# Patient Record
Sex: Male | Born: 1965 | Race: White | Hispanic: No | Marital: Married | Smoking: Former smoker
Health system: Southern US, Community
[De-identification: ages and names within clinical notes are randomized; demographics above are authoritative.]

## PROBLEM LIST (undated history)

## (undated) DIAGNOSIS — M549 Dorsalgia, unspecified: Secondary | ICD-10-CM

## (undated) DIAGNOSIS — G8929 Other chronic pain: Secondary | ICD-10-CM

## (undated) DIAGNOSIS — M519 Unspecified thoracic, thoracolumbar and lumbosacral intervertebral disc disorder: Secondary | ICD-10-CM

## (undated) DIAGNOSIS — N200 Calculus of kidney: Secondary | ICD-10-CM

## (undated) DIAGNOSIS — H5440 Blindness, one eye, unspecified eye: Secondary | ICD-10-CM

## (undated) HISTORY — DX: Blindness, one eye, unspecified eye: H54.40

## (undated) HISTORY — DX: Calculus of kidney: N20.0

## (undated) HISTORY — PX: OTHER SURGICAL HISTORY: SHX169

## (undated) HISTORY — DX: Unspecified thoracic, thoracolumbar and lumbosacral intervertebral disc disorder: M51.9

---

## 2004-07-29 ENCOUNTER — Ambulatory Visit: Payer: Self-pay | Admitting: Family Medicine

## 2004-07-29 ENCOUNTER — Observation Stay (HOSPITAL_COMMUNITY): Admission: EM | Admit: 2004-07-29 | Discharge: 2004-07-30 | Payer: Self-pay | Admitting: Emergency Medicine

## 2004-09-01 ENCOUNTER — Encounter
Admission: RE | Admit: 2004-09-01 | Discharge: 2004-09-24 | Payer: Self-pay | Admitting: Physical Medicine and Rehabilitation

## 2006-03-25 ENCOUNTER — Encounter: Admission: RE | Admit: 2006-03-25 | Discharge: 2006-03-25 | Payer: Self-pay | Admitting: Family Medicine

## 2008-08-17 HISTORY — PX: TONSILLECTOMY: SUR1361

## 2011-08-09 ENCOUNTER — Encounter: Payer: Self-pay | Admitting: Emergency Medicine

## 2011-08-09 ENCOUNTER — Emergency Department
Admit: 2011-08-09 | Discharge: 2011-08-09 | Disposition: A | Discharge status: Home / Self Care | Payer: BC Managed Care – PPO

## 2011-08-09 ENCOUNTER — Emergency Department
Admission: EM | Admit: 2011-08-09 | Discharge: 2011-08-09 | Disposition: A | Discharge status: Home / Self Care | Payer: BC Managed Care – PPO | Source: Home / Self Care | Attending: Emergency Medicine | Admitting: Emergency Medicine

## 2011-08-09 DIAGNOSIS — M545 Low back pain, unspecified: Secondary | ICD-10-CM

## 2011-08-09 HISTORY — DX: Other chronic pain: G89.29

## 2011-08-09 HISTORY — DX: Dorsalgia, unspecified: M54.9

## 2011-08-09 MED ORDER — ETODOLAC 500 MG PO TABS: 500.0000 mg | ORAL_TABLET | Freq: Two times a day (BID) | ORAL | Status: AC

## 2011-08-09 MED ORDER — CYCLOBENZAPRINE HCL 10 MG PO TABS: 10.0000 mg | ORAL_TABLET | Freq: Three times a day (TID) | ORAL | Status: AC | PRN

## 2011-08-09 MED ORDER — PREDNISONE (PAK) 10 MG PO TABS: ORAL_TABLET | ORAL | Status: AC

## 2011-08-09 NOTE — ED Notes (Signed)
Hx of chronic back pain with deteriorating L4 and L5; being followed by Dr.Ramos( with [facility]); 2 days ago braked In traffic and jostled back leading to progressively worse pain. Took Oxycodone at 11am; Ibuprofen at 1:30pm.

## 2011-08-09 NOTE — ED Provider Notes (Addendum)
History     CSN: [id number]  Arrival date & time 08/09/11  1744   First MD Initiated Contact with Patient 08/09/11 1759      Chief Complaint  Patient presents with  . Back Pain   The patient and his wife arrived in our urgent care office [facility]) within 30 minutes of our closing time. Today is Sunday, 08/09/11.   Patient is a 45 y.o. male presenting with back pain. The history is provided by the patient and the spouse.  Back Pain  This is a recurrent problem. The current episode started 2 days ago. The problem occurs constantly. The problem has been rapidly worsening. The pain is present in the lumbar spine. The quality of the pain is described as shooting and stabbing. Radiates to: Right buttock and right upper thigh, but not any lower than this. The pain is at a severity of 10/10. The symptoms are aggravated by bending, twisting and certain positions. The pain is the same all the time. Stiffness is present all day. Pertinent negatives include no chest pain, no fever, no numbness, no abdominal pain, no bladder incontinence, no paresthesias and no weakness.   he denies any focal weakness  Per patient and his wife, Hx of chronic lumbar back pain with deteriorating L4 and L5; being followed by Dr.Ramos( with [facility]); He states that 2 days ago braked while driving,  In traffic and jostled back leading to progressively worse pain. Took Oxycodone at 11am; Ibuprofen at 1:30pm. With only minimal relief. He states he has long standing chronic lumbar back pain, has never had surgery. He states he is followed ongoing by Dr. Ethelene Hal, who prescribes #90 Percocet 10/325 every 30 days, and he takes this 3 times a day. He states he is compliant and does not abuse this. He states he has a medication contract with Dr. Ethelene Hal. He states that he tried to call Dr. Ethelene Hal office, late on Friday when the pain first started, and he physician on call advised him to go to an urgent care center  or emergency room.    Past Medical History  Diagnosis Date  . Back pain, chronic     Past Surgical History  Procedure Date  . Cholecystectomy     Family History  Problem Relation Age of Onset  . Hypertension Mother   . Stroke Father     History  Substance Use Topics  . Smoking status: Current Everyday Smoker -- 0.5 packs/day  . Smokeless tobacco: Not on file  . Alcohol Use: No      Review of Systems  Constitutional: Negative for fever.  HENT: Negative for congestion, neck pain and neck stiffness.   Eyes: Negative.   Respiratory: Negative.   Cardiovascular: Negative for chest pain, palpitations and leg swelling.  Gastrointestinal: Negative for abdominal pain and abdominal distention.  Genitourinary: Negative.  Negative for bladder incontinence and difficulty urinating.  Musculoskeletal: Positive for back pain. Negative for joint swelling.  Neurological: Negative for dizziness, tremors, seizures, weakness, light-headedness, numbness and paresthesias.  Hematological: Negative.   Psychiatric/Behavioral: Negative for hallucinations, confusion and agitation.    Allergies  Review of patient's allergies indicates no known allergies.  Home Medications   Current Outpatient Rx  Name Route Sig Dispense Refill  . OXYCODONE-ACETAMINOPHEN 10-325 MG PO TABS Oral Take 1 tablet by mouth every 8 (eight) hours as needed.      . CYCLOBENZAPRINE HCL 10 MG PO TABS Oral Take 1 tablet (10 mg total) by mouth 3 (  three) times daily as needed for muscle spasms. Take one half or 1 tablet by mouth every 8 hours as needed for muscle relaxant 21 tablet 0  . ETODOLAC 500 MG PO TABS Oral Take 1 tablet (500 mg total) by mouth 2 (two) times daily with a meal. For pain 20 tablet 0  . PREDNISONE (PAK) 10 MG PO TABS  Take as directed x6 days 21 tablet 0    BP 118/86  Pulse 90  Temp(Src) 97.4 F (36.3 C) (Oral)  Resp 16  Ht 6' (1.829 m)  Wt 200 lb (90.719 kg)  BMI 27.12 kg/m2  SpO2  99%  Physical Exam  Nursing note and vitals reviewed. Constitutional: He is oriented to person, place, and time. He appears well-developed and well-nourished. He is cooperative.  Non-toxic appearance. He appears distressed (Appears very uncomfortable from low back pain, and he moves slowly, splinting himself to avoid the pain.).  HENT:  Head: Normocephalic and atraumatic.  Mouth/Throat: Oropharynx is clear and moist.  Eyes: EOM are normal. Pupils are equal, round, and reactive to light. No scleral icterus.  Neck: Normal range of motion. Neck supple.  Cardiovascular: Normal rate, regular rhythm and normal heart sounds.   Pulmonary/Chest: Effort normal and breath sounds normal. No respiratory distress. He has no wheezes. He has no rales. He exhibits no tenderness.  Abdominal: Soft. There is no tenderness.  Musculoskeletal: He exhibits no edema.       Right hip: Normal.       Left hip: Normal.       Cervical back: He exhibits no tenderness.       Thoracic back: He exhibits no tenderness.       Lumbar back: He exhibits decreased range of motion, tenderness, bony tenderness and spasm. He exhibits no swelling, no edema, no deformity, no laceration and normal pulse.       Positive at 45, Right straight leg-raise test. Positive at 45, Left straight leg-raise test.  Negative Right Luisa Hart test. Negative Left Luisa Hart test.    Neurological: He is alert and oriented to person, place, and time. He has normal strength. He displays no atrophy, no tremor and normal reflexes. No cranial nerve deficit or sensory deficit. He exhibits normal muscle tone. Gait (Antalgic) abnormal. Coordination normal.  Reflex Scores:      Patellar reflexes are 2+ on the right side and 2+ on the left side.      Achilles reflexes are 2+ on the right side and 2+ on the left side. Skin: Skin is warm, dry and intact. No lesion and no rash noted.  Psychiatric: He has a normal mood and affect.    ED Course   Procedures   Dg Lumbar Spine Complete  08/09/2011  *RADIOLOGY REPORT*  Clinical Data: Severe low back pain.  LUMBAR SPINE - COMPLETE 4+ VIEW  Comparison:  None.  Findings:  There is no evidence of lumbar spine fracture. Alignment is normal.  Intervertebral disc spaces are maintained.  IMPRESSION: Negative.  Original Report Authenticated By: Danae Orleans, M.D.     1. Low back pain       MDM  I reviewed and negative x-ray report with him and wife.  My assessment is severe lumbar pain, possibly radiculopathy. But neurologic exam is intact. And this diagnosis and management is complicated by his history of chronic pain, followed by Dr. Ethelene Hal.  I went online to the [facility] control drug registry site, and printed out patient's narcotic prescription history over the  past 6 months. Indeed, he has been getting #90 Percocet 10/325 every 30 days from 04/15/11 to the present. The last such prescription by Dr. Ethelene Hal' office was 07/13/11. However, he did receive #10 as acetaminophen codeine #30 tablets and 120 mL's of hydrocodone-chlorpheniramine suspension from [facility], [place] in [place] on 07/16/11 --- I confronted him with this printout, and patient states that those 2 prescriptions were for a bronchitis/URI severe cough at that time.-The patient answered this in a forthright manner and was not belligerent or posturing when I addressed this issue with him.  I prescribed prednisone Dosepak, Lodine, and Flexeril (see details in the AVS). I told him to continue the Percocet prescribed by Dr. Ethelene Hal 3 times a day, and that I would not prescribe any more Percocet or any controlled drugs for the pain. Patient responded to this appropriately and he stated that he was appreciative for my care.  Advised patient that I will be sending a copy of this note to Dr. Ethelene Hal. Advised him to followup with Dr. Ethelene Hal in one week, sooner if worsening or new symptoms.        Lonell Face, MD 08/09/11 2009  Lonell Face, MD 08/09/11 2011

## 2012-08-17 DIAGNOSIS — N200 Calculus of kidney: Secondary | ICD-10-CM

## 2012-08-17 HISTORY — PX: CHOLECYSTECTOMY: SHX55

## 2012-08-17 HISTORY — DX: Calculus of kidney: N20.0

## 2013-01-31 ENCOUNTER — Telehealth: Payer: Self-pay | Admitting: Family Medicine

## 2013-01-31 ENCOUNTER — Ambulatory Visit (INDEPENDENT_AMBULATORY_CARE_PROVIDER_SITE_OTHER): Payer: BC Managed Care – PPO | Admitting: Family Medicine

## 2013-01-31 ENCOUNTER — Encounter: Payer: Self-pay | Admitting: Family Medicine

## 2013-01-31 VITALS — BP 114/70 | HR 73 | Temp 97.7°F | Ht 72.0 in | Wt 206.0 lb

## 2013-01-31 DIAGNOSIS — R300 Dysuria: Secondary | ICD-10-CM | POA: Insufficient documentation

## 2013-01-31 DIAGNOSIS — N23 Unspecified renal colic: Secondary | ICD-10-CM | POA: Insufficient documentation

## 2013-01-31 LAB — POCT UA - MICROSCOPIC ONLY
Casts, Ur, LPF, POC: NEGATIVE
Crystals, Ur, HPF, POC: NEGATIVE
Yeast, UA: NEGATIVE

## 2013-01-31 LAB — POCT URINALYSIS DIPSTICK
Bilirubin, UA: NEGATIVE
Glucose, UA: NEGATIVE
Ketones, UA: NEGATIVE
Leukocytes, UA: NEGATIVE
Nitrite, UA: NEGATIVE
Spec Grav, UA: 1.01
Urobilinogen, UA: NEGATIVE
pH, UA: 8.5

## 2013-01-31 MED ORDER — SULFAMETHOXAZOLE-TRIMETHOPRIM 800-160 MG PO TABS: 1.0000 | ORAL_TABLET | Freq: Two times a day (BID) | ORAL | Status: DC

## 2013-01-31 MED ORDER — TAMSULOSIN HCL 0.4 MG PO CAPS: 0.4000 mg | ORAL_CAPSULE | Freq: Every day | ORAL | Status: DC

## 2013-01-31 NOTE — Progress Notes (Signed)
Patient ID: Levi Washington, male   DOB: [birth year], 47 y.o.   MRN: [id number] SUBJECTIVE: CC: Chief Complaint  Patient presents with  . Dysuria    Hx of kidney stones  . Back Pain  . Abdominal Pain    HPI: Patient seen in the late  Evening clinic Started earlier today. Called EMS then pain eased up. EMT commented that this most likely is a kidney stone. Had kidney stones 3 years ago. Pain initially 10/10 now it is 2/10. Came to get checked. No vomiting no nausea.  PMH/PSH: reviewed/updated in Epic  SH/FH: reviewed/updated in Epic  Allergies: reviewed/updated in Epic  Medications: reviewed/updated in Epic  Immunizations: reviewed/updated in Epic  ROS: As above in the HPI. All other systems are stable or negative.  OBJECTIVE: APPEARANCE:  Patient in no acute distress.The patient appeared well nourished and normally developed. Acyanotic. Waist: VITAL SIGNS:BP 114/70  Pulse 73  Temp(Src) 97.7 F (36.5 C) (Oral)  Ht 6' (1.829 m)  Wt 206 lb (93.441 kg)  BMI 27.93 kg/m2 WM NAD  SKIN: warm and  Dry without overt rashes, tattoos and scars  HEAD and Neck: without JVD, Head and scalp: normal Eyes:No scleral icterus. Fundi normal, eye movements normal. Ears: Auricle normal, canal normal, Tympanic membranes normal, insufflation normal. Nose: normal Throat: normal Neck & thyroid: normal  CHEST & LUNGS: Chest wall: normal Lungs: Clear  CVS: Reveals the PMI to be normally located. Regular rhythm, First and Second Heart sounds are normal,  absence of murmurs, rubs or gallops. Peripheral vasculature: Radial pulses: normal Dorsal pedis pulses: normal Posterior pulses: normal  ABDOMEN:  Appearance: normal Benign, no organomegaly, no masses, no Abdominal Aortic enlargement. No Guarding , no rebound. No Bruits. Bowel sounds: normal  RECTAL: N/A GU: N/A  EXTREMETIES: nonedematous. Both Femoral and Pedal pulses are normal.  MUSCULOSKELETAL:  Spine:  normal Joints: intact  NEUROLOGIC: oriented to time,place and person; nonfocal. Strength is normal Sensory is normal Reflexes are normal Cranial Nerves are normal.  ASSESSMENT:  Dysuria - Plan: POCT urinalysis dipstick, POCT UA - Microscopic Only, Urine culture, sulfamethoxazole-trimethoprim (BACTRIM DS,SEPTRA DS) 800-160 MG per tablet  Ureteric colic - Plan: tamsulosin (FLOMAX) 0.4 MG CAPS   PLAN: Orders Placed This Encounter  Procedures  . Urine culture  . POCT urinalysis dipstick  . POCT UA - Microscopic Only   Results for orders placed in visit on 01/31/13  POCT URINALYSIS DIPSTICK      Result Value Range   Color, UA yellow     Clarity, UA clear     Glucose, UA neg     Bilirubin, UA neg     Ketones, UA neg     Spec Grav, UA 1.010     Blood, UA mod     pH, UA 8.5     Protein, UA mod     Urobilinogen, UA negative     Nitrite, UA neg     Leukocytes, UA Negative    POCT UA - MICROSCOPIC ONLY      Result Value Range   WBC, Ur, HPF, POC 10-15     RBC, urine, microscopic 10-30     Bacteria, U Microscopic few     Mucus, UA large     Epithelial cells, urine per micros few     Crystals, Ur, HPF, POC neg     Casts, Ur, LPF, POC neg     Yeast, UA neg     Meds ordered this encounter  Medications  .  tamsulosin (FLOMAX) 0.4 MG CAPS    Sig: Take 1 capsule (0.4 mg total) by mouth daily.    Dispense:  15 capsule    Refill:  0  . sulfamethoxazole-trimethoprim (BACTRIM DS,SEPTRA DS) 800-160 MG per tablet    Sig: Take 1 tablet by mouth 2 (two) times daily.    Dispense:  14 tablet    Refill:  0   Strainer given.  Increased fluids 2L /24 hour  If stone not passed within a week RTcc for  Recheck. To ED if acute relapse and no relief. Patient has opioid analgesics.   Return if symptoms worsen or fail to improve.  Gatlyn Lipari P. Modesto Charon, M.D.

## 2013-01-31 NOTE — Telephone Encounter (Signed)
Dysuria, frequency, and scant urination since this morning.  Denies hematuria or abd/back pain.  Appt scheduled for this evening.  Pt aware.

## 2013-02-09 ENCOUNTER — Telehealth: Payer: Self-pay | Admitting: Family Medicine

## 2013-02-09 ENCOUNTER — Ambulatory Visit (INDEPENDENT_AMBULATORY_CARE_PROVIDER_SITE_OTHER): Payer: BC Managed Care – PPO | Admitting: Physician Assistant

## 2013-02-09 ENCOUNTER — Ambulatory Visit (INDEPENDENT_AMBULATORY_CARE_PROVIDER_SITE_OTHER): Payer: BC Managed Care – PPO

## 2013-02-09 ENCOUNTER — Encounter: Payer: Self-pay | Admitting: Physician Assistant

## 2013-02-09 VITALS — BP 119/74 | HR 75 | Temp 99.0°F | Wt 210.0 lb

## 2013-02-09 DIAGNOSIS — M25561 Pain in right knee: Secondary | ICD-10-CM

## 2013-02-09 DIAGNOSIS — M25569 Pain in unspecified knee: Secondary | ICD-10-CM

## 2013-02-09 NOTE — Progress Notes (Signed)
Subjective:     Patient ID: Levi Washington, male   DOB: 02-Nov-1965, 47 y.o.   MRN: [id number]  Knee Pain      Review of Systems  All other systems reviewed and are negative.  Pt twisted R knee in the mud at baseball He states no real pain at the time of the injury He then noted pain and swelling the next am Pt has tried OTC NSAIDS but sx cont Denies locking or giving way No hx of prev injury/surgery to the knee     Objective:   Physical Exam  Nursing note and vitals reviewed.  Sl effusion/no ecchy FROM knee with sl patellar click + TTP medial patella and femoral condyle Sl medial laxity no other laxity noted Good strength distal Pulses/sensory good Xray- sl medial joint narrowing bilat no acute bony abnl- will be over-read      Assessment:     R knee pain    Plan:     Rest Ice/Heat Neoprene brace for 2 weeks F/U if any locking or giving way

## 2013-02-09 NOTE — Patient Instructions (Signed)
Patellar Dislocation A dislocated kneecap (patella) happens when the patella slides to the outside of the knee. Sometimes, it slides back into place on its own. Sometimes, manipulation is needed to move the patella back into place. This injury tears the muscle and tendon connections to the patella along the inside of the knee. Other knee problems can happen with patellar dislocation. These include:  Poor healing of the tissues, with a loose joint.  Repeated dislocations.  Damage to the joint cartilage.  Broken (fractured) bone and cartilage. Patellar dislocations can be reduced by straightening the knee and pushing gently on the patella. You can move it back into normal position. A splint is normally applied after the patella is put back in place. Keep the splint on until you see your caregiver in 1 to 2 weeks. Pack your knee in ice for 20 to 30 minutes every few hours or for longer periods if you are in a cast.  If this is the first time this has happened, do not use your knee too soon. It takes 4 to 6 weeks for this injury to heal properly. Surgery to tighten up the knee may be needed if the patella keeps dislocating. See your caregiver for proper follow-up and help with rehabilitation. Document Released: 08/03/2005 Document Revised: 10/26/2011 Document Reviewed: 01/22/2007 [facility] Patient Information 2014 [place], [place].

## 2013-02-09 NOTE — Telephone Encounter (Signed)
Patient slipped and hyperextended right knee 2 days ago.    Appt scheduled for this afternoon.  Patient aware.

## 2013-10-20 ENCOUNTER — Encounter (INDEPENDENT_AMBULATORY_CARE_PROVIDER_SITE_OTHER): Payer: Self-pay

## 2013-10-20 ENCOUNTER — Ambulatory Visit (INDEPENDENT_AMBULATORY_CARE_PROVIDER_SITE_OTHER): Payer: BC Managed Care – PPO

## 2013-10-20 ENCOUNTER — Encounter: Payer: Self-pay | Admitting: Nurse Practitioner

## 2013-10-20 ENCOUNTER — Ambulatory Visit (INDEPENDENT_AMBULATORY_CARE_PROVIDER_SITE_OTHER): Payer: BC Managed Care – PPO | Admitting: Nurse Practitioner

## 2013-10-20 VITALS — BP 115/74 | HR 85 | Temp 98.8°F | Ht 72.0 in | Wt 210.0 lb

## 2013-10-20 DIAGNOSIS — R1310 Dysphagia, unspecified: Secondary | ICD-10-CM

## 2013-10-20 MED ORDER — PANTOPRAZOLE SODIUM 40 MG PO TBEC: 40.0000 mg | DELAYED_RELEASE_TABLET | Freq: Every day | ORAL | Status: DC

## 2013-10-20 NOTE — Progress Notes (Signed)
   Subjective:    Patient ID: Levi Washington, male    DOB: 03/30/1966, 48 y.o.   MRN: [id number]  HPI Patient  In c/o chest discomfort- When he swallows he gets chest pain- feels like food and liquids are getting hung in chest. Only happens when he eats- Started 2 days ago. No history of GERD.    Review of Systems  Constitutional: Negative.   HENT: Positive for trouble swallowing.   Respiratory: Negative.   Cardiovascular: Positive for chest pain (only when swallows).  Gastrointestinal: Negative for nausea, vomiting, diarrhea, blood in stool and anal bleeding.  Genitourinary: Negative.   All other systems reviewed and are negative.       Objective:   Physical Exam  Constitutional: He appears well-developed and well-nourished.  Cardiovascular: Normal rate, regular rhythm and normal heart sounds.   Pulmonary/Chest: Effort normal and breath sounds normal.  Abdominal: Soft. Bowel sounds are normal. He exhibits no distension and no mass. There is no tenderness. There is no rebound and no guarding.  BP 115/74  Pulse 85  Temp(Src) 98.8 F (37.1 C) (Oral)  Ht 6' (1.829 m)  Wt 210 lb (95.255 kg)  BMI 28.47 kg/m2   kub- possible hiatal Levi Ralphshernia-Levi Amberli Ruegg, FNP '      Assessment & Plan:   1. Dysphagia   2. Possible hiatal hernia  Meds ordered this encounter  Medications  . pantoprazole (PROTONIX) 40 MG tablet    Sig: Take 1 tablet (40 mg total) by mouth daily.    Dispense:  30 tablet    Refill:  3    Order Specific Question:  Supervising Provider    Answer:  Ernestina PennaMOORE, DONALD W [[id number]]   Avoid carbonated drinks, spicy and fatty foods Referral to GI for endoscopy  Levi Daphine DeutscherMartin, FNP

## 2013-10-20 NOTE — Patient Instructions (Signed)
Hiatal Hernia A hiatal hernia occurs when a part of the stomach slides above the diaphragm. The diaphragm is the thin muscle separating the belly (abdomen) from the chest. A hiatal hernia can be something you are born with or develop over time. Hiatal hernias may allow stomach acid to flow back into your esophagus, the tube which carries food from your mouth to your stomach. If this acid causes problems it is called GERD (gastro-esophageal reflux disease).  SYMPTOMS  Common symptoms of GERD are heartburn (burning in your chest). This is worse when lying down or bending over. It may also cause belching and indigestion. Some of the things which make GERD worse are:  Increased weight pushes on stomach making acid rise more easily.  Smoking markedly increases acid production.  Alcohol decreases lower esophageal sphincter pressure (valve between stomach and esophagus), allowing acid from stomach into esophagus.  Late evening meals and going to bed with a full stomach increases pressure.  Anything that causes an increase in acid production.  Lower esophageal sphincter incompetence. DIAGNOSIS  Hiatal hernia is often diagnosed with x-rays of your stomach and small bowel. This is called an UGI (upper gastrointestinal x-ray). Sometimes a gastroscopic procedure is done. This is a procedure where your caregiver uses a flexible instrument to look into the stomach and small bowel. HOME CARE INSTRUCTIONS   Try to achieve and maintain an ideal body weight.  Avoid drinking alcoholic beverages.  Stop smoking.  Put the head of your bed on 4 to 6 inch blocks. This will keep your head and esophagus higher than your stomach. If you cannot use blocks, sleep with several pillows under your head and shoulders.  Over-the-counter medications will decrease acid production. Your caregiver can also prescribe medications for this. Take as directed.  1/2 to 1 teaspoon of an antacid taken every hour while awake, with  meals and at bedtime, will neutralize acid.  Do not take aspirin, ibuprofen (Advil or Motrin), or other nonsteroidal anti-inflammatory drugs.  Do not wear tight clothing around your chest or stomach.  Eat smaller meals and eat more frequently. This keeps your stomach from getting too full. Eat slowly.  Do not lie down for 2 or 3 hours after eating. Do not eat or drink anything 1 to 2 hours before going to bed.  Avoid caffeine beverages (colas, coffee, cocoa, tea), fatty foods, citrus fruits and all other foods and drinks that contain acid and that seem to increase the problems.  Avoid bending over, especially after eating. Also avoid straining during bowel movements or when urinating or lifting things. Anything that increases the pressure in your belly increases the amount of acid that may be pushed up into your esophagus. SEEK IMMEDIATE MEDICAL CARE IF:  There is change in location (pain in arms, neck, jaw, teeth or back) of your pain, or the pain is getting worse.  You also experience nausea, vomiting, sweating (diaphoresis), or shortness of breath.  You develop continual vomiting, vomit blood or coffee ground material, have bright red blood in your stools, or have black tarry stools. Some of these symptoms could signal other problems such as heart disease. MAKE SURE YOU:   Understand these instructions.  Monitor your condition.  Contact your caregiver if you are not doing well or are getting worse. Document Released: 10/24/2003 Document Revised: 10/26/2011 Document Reviewed: 08/03/2005 [employer] Patient Information 2014 [employer].  

## 2013-10-30 ENCOUNTER — Encounter: Payer: Self-pay | Admitting: Internal Medicine

## 2013-12-05 ENCOUNTER — Encounter: Payer: Self-pay | Admitting: Internal Medicine

## 2013-12-05 ENCOUNTER — Ambulatory Visit (INDEPENDENT_AMBULATORY_CARE_PROVIDER_SITE_OTHER): Payer: BC Managed Care – PPO | Admitting: Internal Medicine

## 2013-12-05 VITALS — BP 90/64 | HR 72 | Ht 71.0 in | Wt 206.4 lb

## 2013-12-05 DIAGNOSIS — K2100 Gastro-esophageal reflux disease with esophagitis, without bleeding: Secondary | ICD-10-CM

## 2013-12-05 DIAGNOSIS — R1319 Other dysphagia: Secondary | ICD-10-CM

## 2013-12-05 NOTE — Patient Instructions (Addendum)
CC: Dr Leodis SiasFrancis Wong, Paulene FloorMary Martin FNP

## 2013-12-05 NOTE — Progress Notes (Signed)
Martha ClanRichard A Parsley 1966/03/07 [id number]  Note: This dictation was prepared with Dragon digital system. Any transcriptional errors that result from this procedure are unintentional.   History of Present Illness:  This is a 48 year old white male with an acute episode of odynophagia and substernal chest pain on 10/20/2013. He was seen by Paulene FloorMary Martin and was put on Protonix 40 mg daily. Within several days, the pain subsided and he has not had any recurrence of the pain. He denies dysphagia, odynophagia or heartburn. He denies history of gastroesophageal reflux. He takes occasional Advil. He takes oxycodone 2 tablets a day for back pain. His weight has been stable. He denies hoarseness, nocturnal regurgitation or nocturnal cough.    Past Medical History  Diagnosis Date  . Back pain, chronic   . Kidney stones 2014  . Blind left eye   . Lumbar disc disorder     Past Surgical History  Procedure Laterality Date  . Cholecystectomy    . Tonsillectomy    . Eye sugery Left     No Known Allergies  Family history and social history have been reviewed.  Review of Systems: Normal bowel habits.  The remainder of the 10 point ROS is negative except as outlined in the H&P  Physical Exam: General Appearance Well developed, in no distress Eyes  Non icteric  HEENT  Non traumatic, normocephalic normal voice Mouth No lesion, tongue papillated, no cheilosis Neck Supple without adenopathy, thyroid not enlarged, no carotid bruits, no JVD Lungs Clear to auscultation bilaterally COR Normal S1, normal S2, regular rhythm, no murmur, quiet precordium Abdomen soft nontender abdomen with normoactive bowel sounds Rectal not done Extremities  No pedal edema Skin No lesions Neurological Alert and oriented x 3 Psychological Normal mood and affect  Assessment and Plan:   Problem #181 48 year old white male who had an episode which was consistent with acute esophagitis. It is not clear what precipitated  but it cleared up within several days of taking Protonix. He has no pre-existing symptoms of gastroesophageal reflux and has not had any residual symptoms since he has been on Protonix. I have asked him to discontinue Protonix. He is not interested in being endoscoped at this point since he is feeling well. If the dysphagia and odynophagia returns after stopping Protonix, he understands that he should be endoscoped. I have asked him to avoid excess Advil or nonsteroidal agents and if he has to take them, he will take Protonix with them.  Problem #2 Colorectal screening. He is 48 years old. In 2 years, we will recommend a screening colonoscopy.    Hart CarwinDora M Merve Hotard 12/05/2013

## 2014-11-13 ENCOUNTER — Encounter: Payer: Self-pay | Admitting: Family Medicine

## 2014-11-13 ENCOUNTER — Ambulatory Visit (INDEPENDENT_AMBULATORY_CARE_PROVIDER_SITE_OTHER): Payer: 59 | Admitting: Family Medicine

## 2014-11-13 VITALS — BP 120/82 | HR 104 | Temp 98.2°F | Ht 71.0 in | Wt 208.0 lb

## 2014-11-13 DIAGNOSIS — B356 Tinea cruris: Secondary | ICD-10-CM | POA: Diagnosis not present

## 2014-11-13 MED ORDER — TERBINAFINE HCL 250 MG PO TABS: 250.0000 mg | ORAL_TABLET | Freq: Every day | ORAL | Status: DC

## 2014-11-13 NOTE — Addendum Note (Signed)
Addended by: Frederica KusterMILLER, STEPHEN M on: 11/13/2014 04:53 PM   Modules accepted: Orders

## 2014-11-13 NOTE — Progress Notes (Signed)
   Subjective:    Patient ID: Levi Washington, male    DOB: 1965-12-17, 49 y.o.   MRN: [id number]  HPI 49 year old gentleman with a growing rash. It's only on the left side there has been some odor associated. There is no past history of same. Patient Active Problem List   Diagnosis Date Noted  . Dysuria 01/31/2013  . Ureteric colic 01/31/2013   Outpatient Encounter Prescriptions as of 11/13/2014  Medication Sig  . oxyCODONE-acetaminophen (PERCOCET) 10-325 MG per tablet Take 1 tablet by mouth every 8 (eight) hours as needed.    . [DISCONTINUED] pantoprazole (PROTONIX) 40 MG tablet Take 1 tablet (40 mg total) by mouth daily.       Review of Systems  Constitutional: Negative.   HENT: Negative.   Eyes: Negative.   Respiratory: Negative.  Negative for shortness of breath.   Cardiovascular: Negative.  Negative for chest pain and leg swelling.  Gastrointestinal: Negative.   Genitourinary: Negative.   Musculoskeletal: Negative.   Skin: Positive for rash.  Neurological: Negative.   Psychiatric/Behavioral: Negative.   All other systems reviewed and are negative.      Objective:   Physical Exam  Skin:  Erythematous irregular border area and the left inguinal region consistent with tinea cruris.    BP 120/82 mmHg  Pulse 104  Temp(Src) 98.2 F (36.8 C) (Oral)  Ht 5\' 11"  (1.803 m)  Wt 208 lb (94.348 kg)  BMI 29.02 kg/m2       Assessment & Plan:  1 Tinea cruris Rash consistent with above fungal infection plan we will begin Lamisil 250 mg twice a day for 1 week. Also recommended Zeasorb-AF powder to be used keep the area dry after this rash clears also consider possible bacterial superinfection since it has an odor.  Frederica KusterStephen M Miller MD

## 2015-01-06 IMAGING — CR DG ABDOMEN 1V
2 series · 2 of 2 positions shown · non-contrast
Comparison: None.

CLINICAL DATA: Pain

EXAM:
ABDOMEN - 1 VIEW

[view not recorded (1 of 2)]
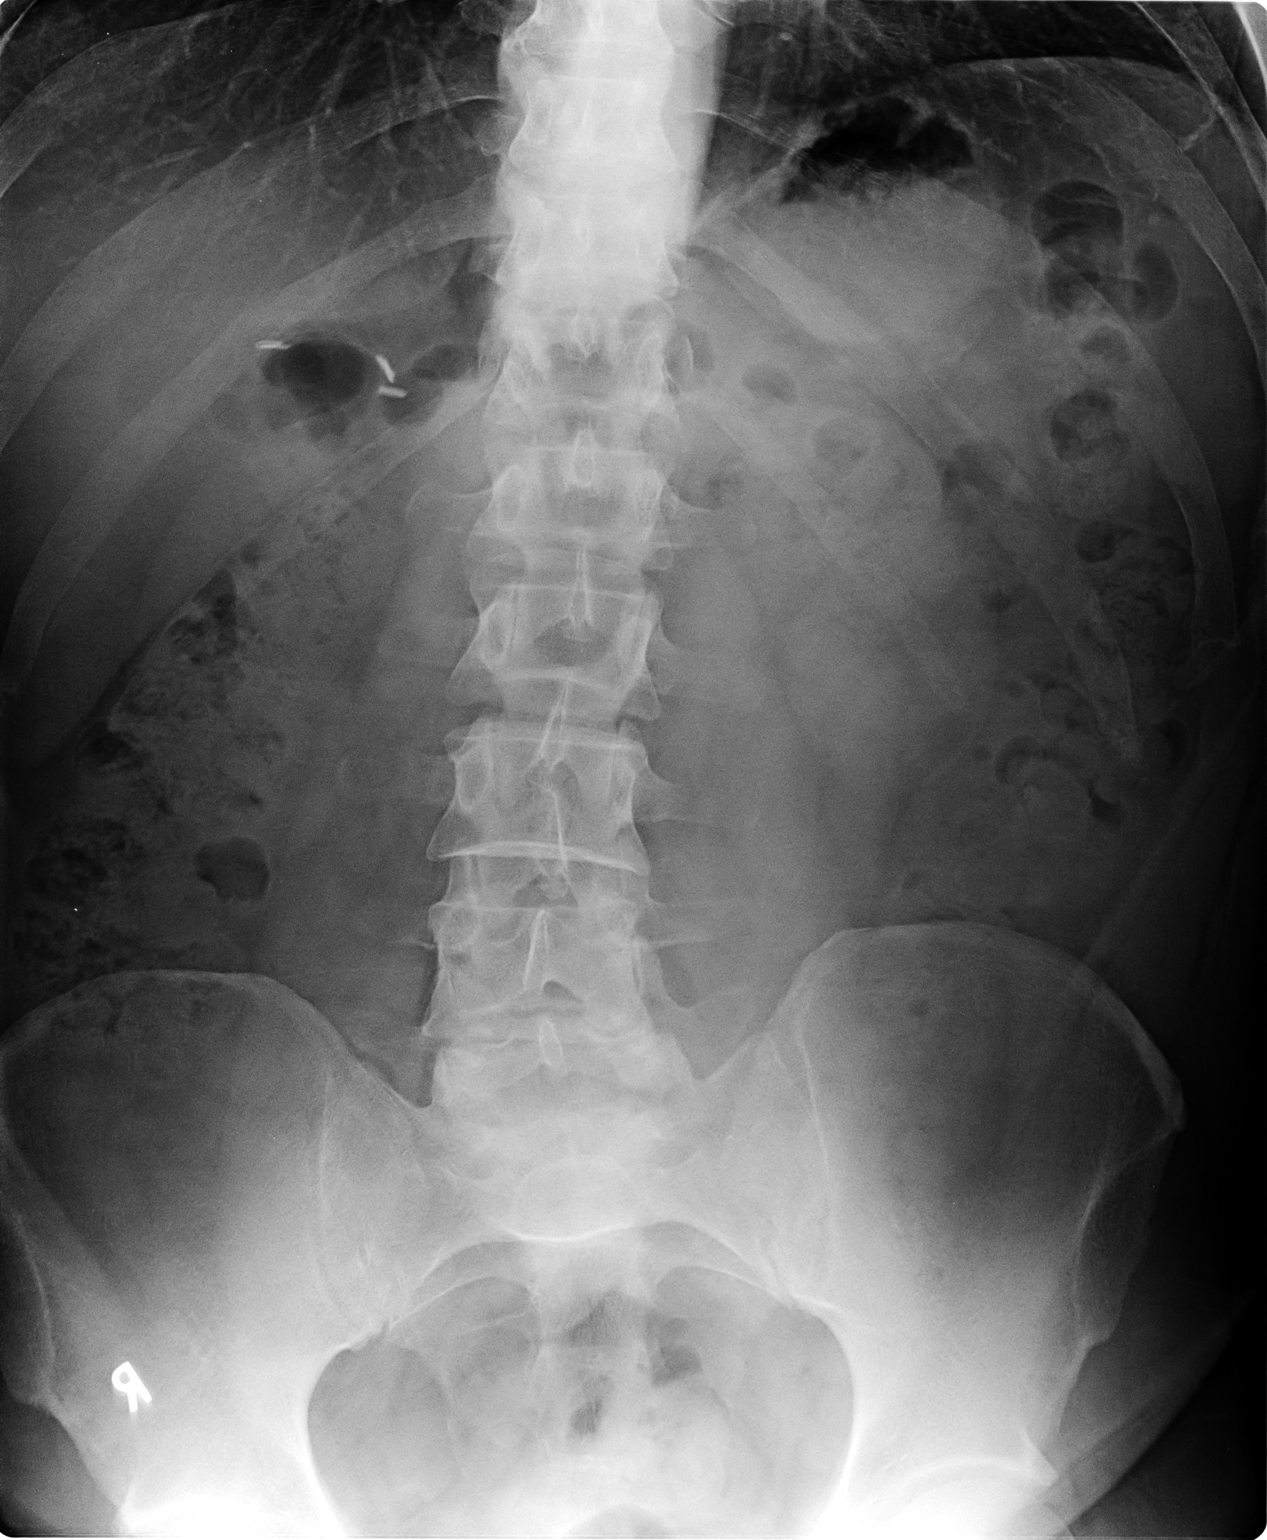

[view not recorded (2 of 2)]
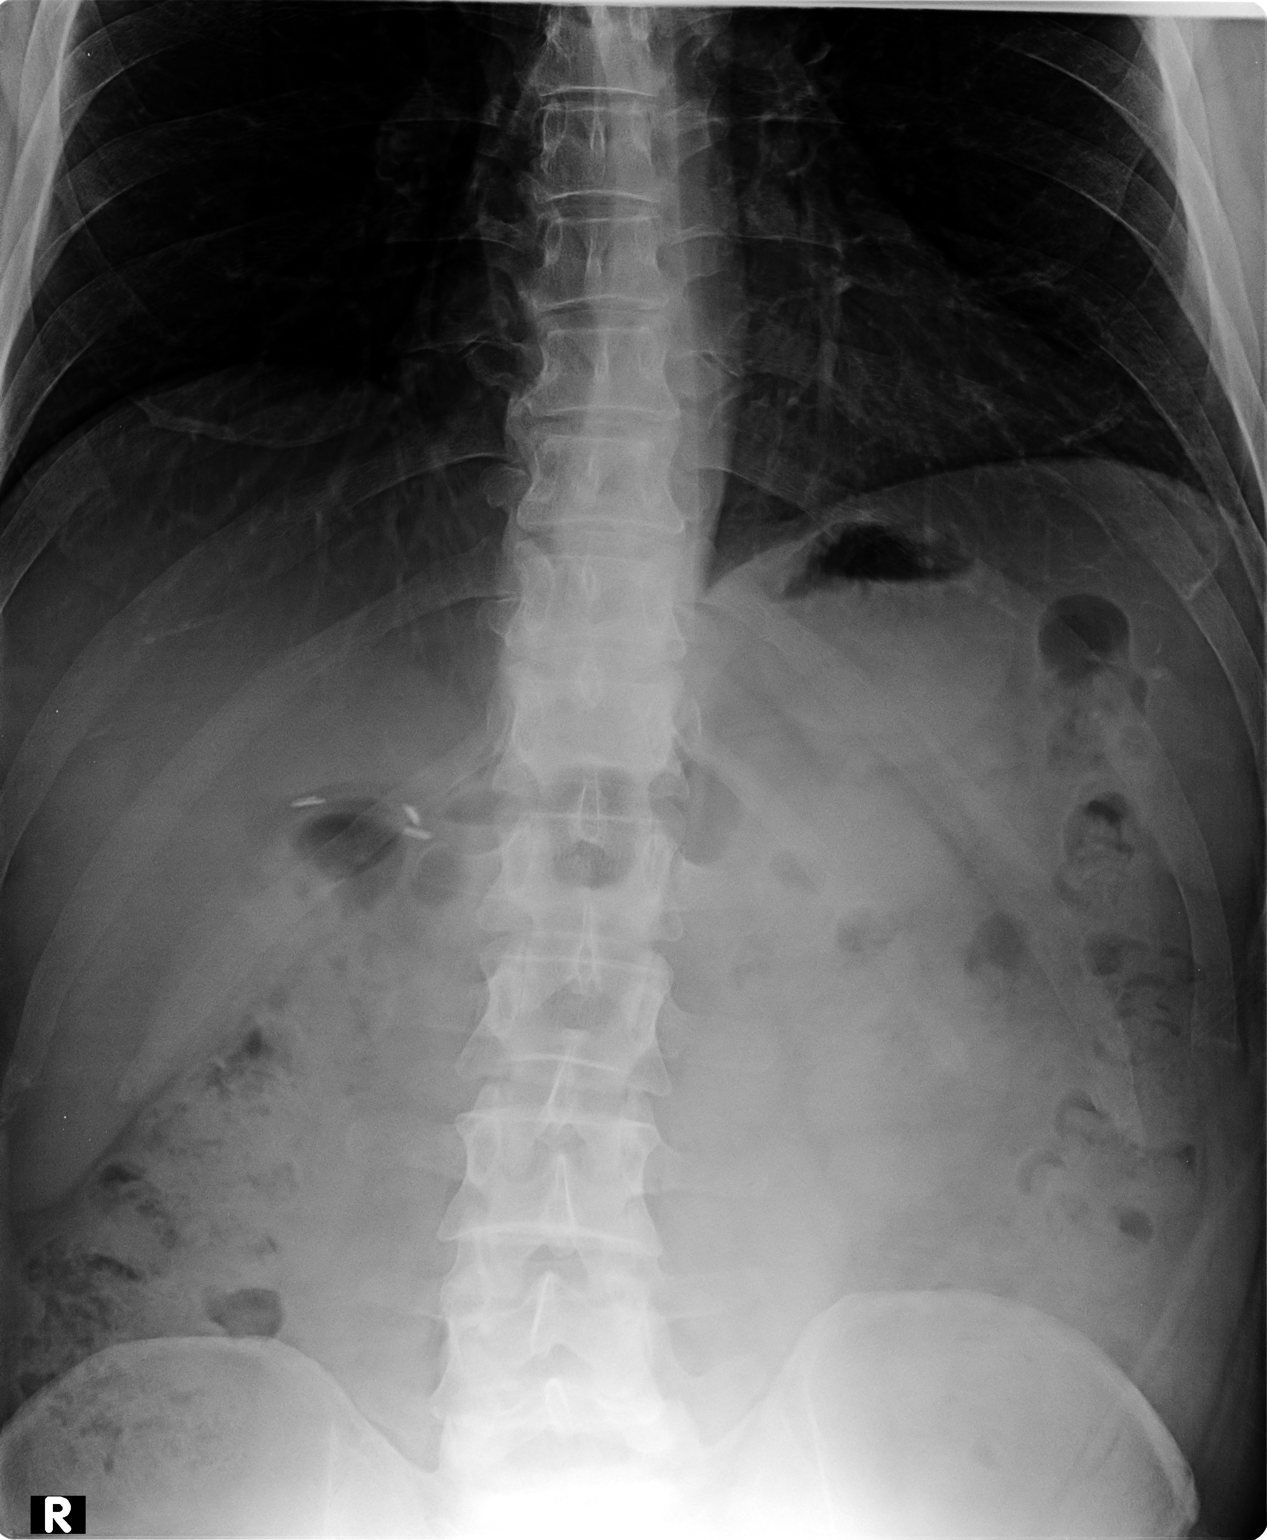

[2 of 2 positions shown; findings below may reference images not displayed]

FINDINGS: There is diffuse stool throughout the colon. The bowel gas pattern
is unremarkable. No obstruction or free air is seen on this supine
examination. There are surgical clips in the right upper quadrant
region.
IMPRESSION: Stool throughout colon.  Overall bowel gas pattern unremarkable.

## 2015-06-07 ENCOUNTER — Ambulatory Visit (INDEPENDENT_AMBULATORY_CARE_PROVIDER_SITE_OTHER): Payer: BLUE CROSS/BLUE SHIELD | Admitting: Family Medicine

## 2015-06-07 ENCOUNTER — Encounter: Payer: Self-pay | Admitting: Family Medicine

## 2015-06-07 VITALS — BP 124/81 | HR 68 | Temp 98.0°F | Ht 71.0 in | Wt 207.6 lb

## 2015-06-07 DIAGNOSIS — K439 Ventral hernia without obstruction or gangrene: Secondary | ICD-10-CM

## 2015-06-07 NOTE — Patient Instructions (Signed)
Great to meet you!  Hernia, Adult A hernia is the bulging of an organ or tissue through a weak spot in the muscles of the abdomen (abdominal wall). Hernias develop most often near the navel or groin. There are many kinds of hernias. Common kinds include:  Femoral hernia. This kind of hernia develops under the groin in the upper thigh area.  Inguinal hernia. This kind of hernia develops in the groin or scrotum.  Umbilical hernia. This kind of hernia develops near the navel.  Hiatal hernia. This kind of hernia causes part of the stomach to be pushed up into the chest.  Incisional hernia. This kind of hernia bulges through a scar from an abdominal surgery. CAUSES This condition may be caused by:  Heavy lifting.  Coughing over a long period of time.  Straining to have a bowel movement.  An incision made during an abdominal surgery.  A birth defect (congenital defect).  Excess weight or obesity.  Smoking.  Poor nutrition.  Cystic fibrosis.  Excess fluid in the abdomen.  Undescended testicles. SYMPTOMS Symptoms of a hernia include:  A lump on the abdomen. This is the first sign of a hernia. The lump may become more obvious with standing, straining, or coughing. It may get bigger over time if it is not treated or if the condition causing it is not treated.  Pain. A hernia is usually painless, but it may become painful over time if treatment is delayed. The pain is usually dull and may get worse with standing or lifting heavy objects. Sometimes a hernia gets tightly squeezed in the weak spot (strangulated) or stuck there (incarcerated) and causes additional symptoms. These symptoms may include:  Vomiting.  Nausea.  Constipation.  Irritability. DIAGNOSIS A hernia may be diagnosed with:  A physical exam. During the exam your health care provider may ask you to cough or to make a specific movement, because a hernia is usually more visible when you move.  Imaging tests.  These can include:  X-rays.  Ultrasound.  CT scan. TREATMENT A hernia that is small and painless may not need to be treated. A hernia that is large or painful may be treated with surgery. Inguinal hernias may be treated with surgery to prevent incarceration or strangulation. Strangulated hernias are always treated with surgery, because lack of blood to the trapped organ or tissue can cause it to die. Surgery to treat a hernia involves pushing the bulge back into place and repairing the weak part of the abdomen. HOME CARE INSTRUCTIONS  Avoid straining.  Do not lift anything heavier than 10 lb (4.5 kg).  Lift with your leg muscles, not your back muscles. This helps avoid strain.  When coughing, try to cough gently.  Prevent constipation. Constipation leads to straining with bowel movements, which can make a hernia worse or cause a hernia repair to break down. You can prevent constipation by:  Eating a high-fiber diet that includes plenty of fruits and vegetables.  Drinking enough fluids to keep your urine clear or pale yellow. Aim to drink 6-8 glasses of water per day.  Using a stool softener as directed by your health care provider.  Lose weight, if you are overweight.  Do not use any tobacco products, including cigarettes, chewing tobacco, or electronic cigarettes. If you need help quitting, ask your health care provider.  Keep all follow-up visits as directed by your health care provider. This is important. Your health care provider may need to monitor your condition. SEEK MEDICAL  CARE IF:  You have swelling, redness, and pain in the affected area.  Your bowel habits change. SEEK IMMEDIATE MEDICAL CARE IF:  You have a fever.  You have abdominal pain that is getting worse.  You feel nauseous or you vomit.  You cannot push the hernia back in place by gently pressing on it while you are lying down.  The hernia:  Changes in shape or size.  Is stuck outside the  abdomen.  Becomes discolored.  Feels hard or tender.   This information is not intended to replace advice given to you by your health care provider. Make sure you discuss any questions you have with your health care provider.   Document Released: 08/03/2005 Document Revised: 08/24/2014 Document Reviewed: 06/13/2014 Elsevier Interactive Patient Education [employer]2016 Elsevier Inc.

## 2015-06-07 NOTE — Progress Notes (Signed)
   HPI  Patient presents today here for evaluation of possible hernia.  Patient explains for about 4 months he's had right-sided abdominal pain around the site of his previous cholecystectomy laparoscopic incision Over the last month iit has worsened and he now has afrequent painful area around the site of his previous surgical scar He states that it's worse with lifting, sweeping, and worse since he started batting practice, he is a Secondary school teacherbaseball coach.  He describes it is painful to the touch, and has never been red, or hard feeling. He denies any fevers, chills, sweats  PMH: Smoking status noted ROS: Per HPI  Objective: BP 124/81 mmHg  Pulse 68  Temp(Src) 98 F (36.7 C)  Ht 5\' 11"  (1.803 m)  Wt 207 lb 9.6 oz (94.167 kg)  BMI 28.97 kg/m2 Gen: NAD, alert, cooperative with exam HEENT: NCAT CV: RRR, good S1/S2, no murmur Resp: CTABL, no wheezes, non-labored Abd: SNTND, BS present, no guarding or organomegaly Abdominal wall withwell-healed scar on right lower quadrant, he has a small palpable abdominal wall defect just below the scar Ext: No edema, warm Neuro: Alert and oriented, No gross deficits  Assessment and plan:  # bdominal wall hernia Secondary to previous surgery Refer to general surgery Red flags and signs of incarcerated hernia. RTC as needed and reasons to seek emergent care outlined    Orders Placed This Encounter  Procedures  . Ambulatory referral to General Surgery    Referral Priority:  Routine    Referral Type:  Surgical    Referral Reason:  Specialty Services Required    Requested Specialty:  General Surgery    Number of Visits Requested:  1     Murtis SinkSam Luretha Eberly, MD [facility] 06/07/2015, 9:15 AM

## 2015-06-14 ENCOUNTER — Encounter: Payer: Self-pay | Admitting: Nurse Practitioner

## 2015-06-14 ENCOUNTER — Ambulatory Visit (INDEPENDENT_AMBULATORY_CARE_PROVIDER_SITE_OTHER): Payer: BLUE CROSS/BLUE SHIELD | Admitting: Nurse Practitioner

## 2015-06-14 VITALS — BP 119/79 | HR 87 | Temp 98.0°F | Ht 71.0 in | Wt 205.0 lb

## 2015-06-14 DIAGNOSIS — L03811 Cellulitis of head [any part, except face]: Secondary | ICD-10-CM | POA: Diagnosis not present

## 2015-06-14 DIAGNOSIS — H61032 Chondritis of left external ear: Secondary | ICD-10-CM

## 2015-06-14 MED ORDER — CEPHALEXIN 500 MG PO CAPS: 500.0000 mg | ORAL_CAPSULE | Freq: Four times a day (QID) | ORAL | Status: DC

## 2015-06-14 MED ORDER — PREDNISONE 20 MG PO TABS: ORAL_TABLET | ORAL | Status: DC

## 2015-06-14 MED ORDER — METHYLPREDNISOLONE ACETATE 80 MG/ML IJ SUSP
80.0000 mg | Freq: Once | INTRAMUSCULAR | Status: AC
  Administered 2015-06-14: 80 mg via INTRAMUSCULAR

## 2015-06-14 NOTE — Progress Notes (Signed)
   Subjective:    Patient ID: Levi Washington, male    DOB: 12-12-1965, [age] y.o.   MRN: [id number]  HPI Patient comes in c/o left ear red and swollen- noticed it Tuesday morning and has gradually gotten worse- denies injury or bug bite. Says it feels hot and itches.    Review of Systems  Constitutional: Negative.   HENT: Negative.   Respiratory: Negative.   Cardiovascular: Negative.   Genitourinary: Negative.   Neurological: Negative.   Psychiatric/Behavioral: Negative.   All other systems reviewed and are negative.      Objective:   Physical Exam  Constitutional: He appears well-developed and well-nourished.  Cardiovascular: Normal rate, regular rhythm and normal heart sounds.   Pulmonary/Chest: Effort normal and breath sounds normal.  Skin: Skin is warm.  Left auricle hot to touch , edematous with posterior lymohadenopathy.  Psychiatric: He has a normal mood and affect. His behavior is normal. Judgment and thought content normal.    BP 119/79 mmHg  Pulse 87  Temp(Src) 98 F (36.7 C) (Oral)  Ht 5\' 11"  (1.803 m)  Wt 205 lb (92.987 kg)  BMI 28.60 kg/m2       Assessment & Plan:   1. Chondritis of auricle, left   2. Cellulitis of head except face    Meds ordered this encounter  Medications  . methylPREDNISolone acetate (DEPO-MEDROL) injection 80 mg    Sig:   . predniSONE (DELTASONE) 20 MG tablet    Sig: 2 po at sametime daily for 5 days- start tomorrow    Dispense:  10 tablet    Refill:  0    Order Specific Question:  Supervising Provider    Answer:  Ernestina PennaMOORE, DONALD W [[id number]]  . cephALEXin (KEFLEX) 500 MG capsule    Sig: Take 1 capsule (500 mg total) by mouth 4 (four) times daily.    Dispense:  30 capsule    Refill:  0    Order Specific Question:  Supervising Provider    Answer:  Ernestina PennaMOORE, DONALD W [[id number]]   Cool compresses RTO if no improvement  Mary-Margaret Daphine DeutscherMartin, [place]NP

## 2015-06-18 ENCOUNTER — Telehealth: Payer: Self-pay | Admitting: Family Medicine

## 2016-05-04 ENCOUNTER — Encounter: Payer: Self-pay | Admitting: Family Medicine

## 2016-05-04 ENCOUNTER — Ambulatory Visit (INDEPENDENT_AMBULATORY_CARE_PROVIDER_SITE_OTHER): Payer: 59 | Admitting: Family Medicine

## 2016-05-04 VITALS — BP 119/73 | HR 73 | Temp 98.0°F | Ht 71.0 in | Wt 194.8 lb

## 2016-05-04 DIAGNOSIS — Z0000 Encounter for general adult medical examination without abnormal findings: Secondary | ICD-10-CM | POA: Diagnosis not present

## 2016-05-04 DIAGNOSIS — H9313 Tinnitus, bilateral: Secondary | ICD-10-CM

## 2016-05-04 NOTE — Progress Notes (Signed)
   HPI  Patient presents today for an annual physical exam.  He has moderate to severe bilateral tenderness with some high frequency hearing loss, he states that the tenderness associated keep some awake at night He would like evaluation for this.  He is very active but has no regular exercise routine, he plays golf, volleyball, and frequently walks with his wife. He does not watch his diet very closely works as a traveling Barista.  He denies any other concerns today.  He is willing to get a colonoscopy.   PMH: Smoking status noted ROS: Per HPI  Objective: BP 119/73   Pulse 73   Temp 98 F (36.7 C) (Oral)   Ht '5\' 11"'$  (1.803 m)   Wt 194 lb 12.8 oz (88.4 kg)   BMI 27.17 kg/m  Gen: NAD, alert, cooperative with exam HEENT: NCAT, left eye with scarring at 5 and 6:00, dilated pupil compared to the right, EOMI CV: RRR, good S1/S2, no murmur Resp: CTABL, no wheezes, non-labored Abd: SNTND, BS present, no guarding or organomegaly Ext: No edema, warm Neuro: Alert and oriented, 2+ patellar tendon reflexes bilaterally  Assessment and plan:  # Annual physical exam Normal exam Labs, fasting Colonoscopy order placed  # Tinnitus Referring audiology, symmetric tinnitus with high-frequency hearing loss Good relief from hearing aids   Orders Placed This Encounter  Procedures  . CMP14+EGFR    Standing Status:   Future    Standing Expiration Date:   05/04/2017  . CBC with Differential    Standing Status:   Future    Standing Expiration Date:   05/04/2017  . PSA    Standing Status:   Future    Standing Expiration Date:   05/04/2017  . Lipid panel    Standing Status:   Future    Standing Expiration Date:   05/04/2017  . Ambulatory referral to Gastroenterology    Referral Priority:   Routine    Referral Type:   Consultation    Referral Reason:   Specialty Services Required    Number of Visits Requested:   [place], MD [facility] 05/04/2016, 4:44 PM

## 2016-05-04 NOTE — Patient Instructions (Addendum)
Great to see you!  Come back to get fasting labs within 1-2 weeks at your convenience

## 2016-05-05 ENCOUNTER — Other Ambulatory Visit: Payer: 59

## 2016-05-05 DIAGNOSIS — Z0000 Encounter for general adult medical examination without abnormal findings: Secondary | ICD-10-CM

## 2016-05-06 LAB — LIPID PANEL
Chol/HDL Ratio: 6.4 ratio units — ABNORMAL HIGH (ref 0.0–5.0)
Cholesterol, Total: 159 mg/dL (ref 100–199)
HDL: 25 mg/dL — ABNORMAL LOW (ref >39)
LDL Calculated: 91 mg/dL (ref 0–99)
Triglycerides: 215 mg/dL — ABNORMAL HIGH (ref 0–149)
VLDL Cholesterol Cal: 43 mg/dL — ABNORMAL HIGH (ref 5–40)

## 2016-05-06 LAB — CMP14+EGFR
ALT: 16 IU/L (ref 0–44)
AST: 13 IU/L (ref 0–40)
Albumin/Globulin Ratio: 1.5 (ref 1.2–2.2)
Albumin: 4.3 g/dL (ref 3.5–5.5)
Alkaline Phosphatase: 76 IU/L (ref 39–117)
BUN/Creatinine Ratio: 13 (ref 9–20)
BUN: 14 mg/dL (ref 6–24)
Bilirubin Total: 0.4 mg/dL (ref 0.0–1.2)
CO2: 26 mmol/L (ref 18–29)
CREATININE: 1.04 mg/dL (ref 0.76–1.27)
Calcium: 9.3 mg/dL (ref 8.7–10.2)
Chloride: 102 mmol/L (ref 96–106)
GFR calc Af Amer: 96 mL/min/{1.73_m2} (ref >59)
GFR calc non Af Amer: 83 mL/min/{1.73_m2} (ref >59)
Globulin, Total: 2.8 g/dL (ref 1.5–4.5)
Glucose: 100 mg/dL — ABNORMAL HIGH (ref 65–99)
Potassium: 4.9 mmol/L (ref 3.5–5.2)
Sodium: 143 mmol/L (ref 134–144)
Total Protein: 7.1 g/dL (ref 6.0–8.5)

## 2016-05-06 LAB — CBC WITH DIFFERENTIAL/PLATELET
Basophils Absolute: 0.1 10*3/uL (ref 0.0–0.2)
Basos: 1 %
EOS (ABSOLUTE): 0.1 10*3/uL (ref 0.0–0.4)
Eos: 2 %
HEMATOCRIT: 44.6 % (ref 37.5–51.0)
Hemoglobin: 14.9 g/dL (ref 12.6–17.7)
Immature Grans (Abs): 0 10*3/uL (ref 0.0–0.1)
Immature Granulocytes: 0 %
LYMPHS ABS: 3.1 10*3/uL (ref 0.7–3.1)
Lymphs: 44 %
MCH: 30.3 pg (ref 26.6–33.0)
MCHC: 33.4 g/dL (ref 31.5–35.7)
MCV: 91 fL (ref 79–97)
Monocytes Absolute: 0.5 10*3/uL (ref 0.1–0.9)
Monocytes: 8 %
Neutrophils Absolute: 3.1 10*3/uL (ref 1.4–7.0)
Neutrophils: 45 %
Platelets: 331 10*3/uL (ref 150–379)
RBC: 4.91 x10E6/uL (ref 4.14–5.80)
RDW: 13.4 % (ref 12.3–15.4)
WBC: 6.9 10*3/uL (ref 3.4–10.8)

## 2016-05-06 LAB — PSA: Prostate Specific Ag, Serum: 1 ng/mL (ref 0.0–4.0)

## 2016-07-27 ENCOUNTER — Ambulatory Visit: Payer: 59 | Attending: Family Medicine | Admitting: Audiology

## 2016-12-29 ENCOUNTER — Ambulatory Visit (INDEPENDENT_AMBULATORY_CARE_PROVIDER_SITE_OTHER): Payer: 59 | Admitting: Nurse Practitioner

## 2016-12-29 ENCOUNTER — Encounter: Payer: Self-pay | Admitting: Nurse Practitioner

## 2016-12-29 VITALS — BP 125/79 | HR 73 | Temp 97.2°F | Ht 71.0 in | Wt 199.0 lb

## 2016-12-29 DIAGNOSIS — J069 Acute upper respiratory infection, unspecified: Secondary | ICD-10-CM

## 2016-12-29 MED ORDER — AZITHROMYCIN 250 MG PO TABS: ORAL_TABLET | ORAL | 0 refills | Status: DC

## 2016-12-29 MED ORDER — METHYLPREDNISOLONE ACETATE 80 MG/ML IJ SUSP
80.0000 mg | Freq: Once | INTRAMUSCULAR | Status: AC
  Administered 2016-12-29: 80 mg via INTRAMUSCULAR

## 2016-12-29 MED ORDER — HYDROCODONE-HOMATROPINE 5-1.5 MG/5ML PO SYRP: 5.0000 mL | ORAL_SOLUTION | Freq: Four times a day (QID) | ORAL | 0 refills | Status: DC | PRN

## 2016-12-29 NOTE — Progress Notes (Signed)
   Subjective:    Patient ID: Levi Washington, male    DOB: 1966-06-22, 51 y.o.   MRN: [id number]  HPI Patient comes in today c/o cough and congestion that has been going on for greater then a week. His chest muscles hurt from coughing so much. He is leaving for a cruise on Saturday and does not want o be sick.    Review of Systems  Constitutional: Negative for appetite change, chills and fever.  HENT: Positive for congestion and rhinorrhea. Negative for sore throat and trouble swallowing.   Respiratory: Positive for cough (productive- greenish).   Cardiovascular: Negative.   Genitourinary: Negative.   Neurological: Negative.   Psychiatric/Behavioral: Negative.   All other systems reviewed and are negative.      Objective:   Physical Exam  Constitutional: He appears well-developed and well-nourished. No distress.  HENT:  Right Ear: Hearing, tympanic membrane, external ear and ear canal normal.  Left Ear: Hearing, tympanic membrane, external ear and ear canal normal.  Nose: Mucosal edema and rhinorrhea present. Right sinus exhibits no maxillary sinus tenderness and no frontal sinus tenderness. Left sinus exhibits no maxillary sinus tenderness and no frontal sinus tenderness.  Mouth/Throat: Uvula is midline, oropharynx is clear and moist and mucous membranes are normal.   BP 125/79   Pulse 73   Temp 97.2 F (36.2 C) (Oral)   Ht 5\' 11"  (1.803 m)   Wt 199 lb (90.3 kg)   BMI 27.75 kg/m       Assessment & Plan:   1. Upper respiratory infection with cough and congestion    1. Take meds as prescribed 2. Use a cool mist humidifier especially during the winter months and when heat has been humid. 3. Use saline nose sprays frequently 4. Saline irrigations of the nose can be very helpful if done frequently.  * 4X daily for 1 week*  * Use of a nettie pot can be helpful with this. Follow directions with this* 5. Drink plenty of fluids 6. Keep thermostat turn down low 7.For any  cough or congestion  Use plain Mucinex- regular strength or max strength is fine   * Children- consult with Pharmacist for dosing 8. For fever or aces or pains- take tylenol or ibuprofen appropriate for age and weight.  * for fevers greater than 101 orally you may alternate ibuprofen and tylenol every  3 hours.   Meds ordered this encounter  Medications  . methylPREDNISolone acetate (DEPO-MEDROL) injection 80 mg  . azithromycin (ZITHROMAX Z-PAK) 250 MG tablet    Sig: As directed    Dispense:  6 tablet    Refill:  0    Order Specific Question:   Supervising Provider    Answer:   VINCENT, CAROL L [[id number]]  . HYDROcodone-homatropine (HYCODAN) 5-1.5 MG/5ML syrup    Sig: Take 5 mLs by mouth every 6 (six) hours as needed for cough.    Dispense:  120 mL    Refill:  0    Order Specific Question:   Supervising Provider    Answer:   Johna SheriffVINCENT, CAROL L [[id number]]   Mary-Margaret Daphine DeutscherMartin, FNP

## 2016-12-29 NOTE — Patient Instructions (Signed)

## 2017-08-13 ENCOUNTER — Encounter: Payer: 59 | Admitting: Nurse Practitioner

## 2017-08-14 ENCOUNTER — Encounter: Payer: Self-pay | Admitting: Family Medicine

## 2017-08-23 ENCOUNTER — Encounter: Payer: Self-pay | Admitting: Gastroenterology

## 2017-08-23 ENCOUNTER — Ambulatory Visit (INDEPENDENT_AMBULATORY_CARE_PROVIDER_SITE_OTHER): Payer: 59 | Admitting: Nurse Practitioner

## 2017-08-23 ENCOUNTER — Encounter: Payer: Self-pay | Admitting: Nurse Practitioner

## 2017-08-23 VITALS — BP 118/79 | HR 72 | Temp 97.3°F | Ht 71.0 in | Wt 207.0 lb

## 2017-08-23 DIAGNOSIS — Z1211 Encounter for screening for malignant neoplasm of colon: Secondary | ICD-10-CM

## 2017-08-23 DIAGNOSIS — Z0000 Encounter for general adult medical examination without abnormal findings: Secondary | ICD-10-CM | POA: Diagnosis not present

## 2017-08-23 NOTE — Patient Instructions (Signed)

## 2017-08-23 NOTE — Progress Notes (Signed)
   Subjective:    Patient ID: Levi Washington, male    DOB: 1965/10/10, 52 y.o.   MRN: [id number]  HPI Patient comes in today for annual physical. He currently has no chronic medical problems and is on no meds. He is doing well today without complaints.    Review of Systems  Constitutional: Negative for activity change and appetite change.  HENT: Negative.   Eyes: Negative for pain.  Respiratory: Negative for shortness of breath.   Cardiovascular: Negative for chest pain, palpitations and leg swelling.  Gastrointestinal: Negative for abdominal pain.  Endocrine: Negative for polydipsia.  Genitourinary: Negative.   Skin: Negative for rash.  Neurological: Negative for dizziness, weakness and headaches.  Hematological: Does not bruise/bleed easily.  Psychiatric/Behavioral: Negative.   All other systems reviewed and are negative.      Objective:   Physical Exam  Constitutional: He is oriented to person, place, and time. He appears well-developed and well-nourished.  HENT:  Head: Normocephalic.  Right Ear: External ear normal.  Left Ear: External ear normal.  Nose: Nose normal.  Mouth/Throat: Oropharynx is clear and moist.  Eyes: EOM are normal. Pupils are equal, round, and reactive to light.  Left eye deviates lateral  Neck: Normal range of motion. Neck supple. No JVD present. No thyromegaly present.  Cardiovascular: Normal rate, regular rhythm, normal heart sounds and intact distal pulses. Exam reveals no gallop and no friction rub.  No murmur heard. Pulmonary/Chest: Effort normal and breath sounds normal. No respiratory distress. He has no wheezes. He has no rales. He exhibits no tenderness.  Abdominal: Soft. Bowel sounds are normal. He exhibits no mass. There is no tenderness.  Genitourinary:  Genitourinary Comments: Patient denies any problems with private area.  Musculoskeletal: Normal range of motion. He exhibits no edema.  Lymphadenopathy:    He has no cervical  adenopathy.  Neurological: He is alert and oriented to person, place, and time. No cranial nerve deficit.  Skin: Skin is warm and dry.  Psychiatric: He has a normal mood and affect. His behavior is normal. Judgment and thought content normal.   BP 118/79   Pulse 72   Temp (!) 97.3 F (36.3 C) (Oral)   Ht '5\' 11"'$  (1.803 m)   Wt 207 lb (93.9 kg)   BMI 28.87 kg/m      Assessment & Plan:  1. Annual physical exam - CBC with Differential/Platelet - CMP14+EGFR - Lipid panel - PSA, total and free  2. Encounter for screening colonoscopy - Ambulatory referral to Gastroenterology    Labs pending Health maintenance reviewed Diet and exercise encouraged Continue all meds Follow up  In 6 months    [place], FNP

## 2017-08-24 LAB — CMP14+EGFR
A/G RATIO: 1.5 (ref 1.2–2.2)
ALBUMIN: 4.4 g/dL (ref 3.5–5.5)
ALT: 21 IU/L (ref 0–44)
AST: 15 IU/L (ref 0–40)
Alkaline Phosphatase: 74 IU/L (ref 39–117)
BUN / CREAT RATIO: 18 (ref 9–20)
BUN: 16 mg/dL (ref 6–24)
Bilirubin Total: 0.5 mg/dL (ref 0.0–1.2)
CALCIUM: 9.3 mg/dL (ref 8.7–10.2)
CO2: 20 mmol/L (ref 20–29)
Chloride: 106 mmol/L (ref 96–106)
Creatinine, Ser: 0.89 mg/dL (ref 0.76–1.27)
GFR, EST AFRICAN AMERICAN: 114 mL/min/{1.73_m2} (ref >59)
GFR, EST NON AFRICAN AMERICAN: 98 mL/min/{1.73_m2} (ref >59)
Globulin, Total: 2.9 g/dL (ref 1.5–4.5)
Glucose: 103 mg/dL — ABNORMAL HIGH (ref 65–99)
POTASSIUM: 4.5 mmol/L (ref 3.5–5.2)
Sodium: 142 mmol/L (ref 134–144)
TOTAL PROTEIN: 7.3 g/dL (ref 6.0–8.5)

## 2017-08-24 LAB — CBC WITH DIFFERENTIAL/PLATELET
BASOS: 1 %
Basophils Absolute: 0.1 10*3/uL (ref 0.0–0.2)
EOS (ABSOLUTE): 0.1 10*3/uL (ref 0.0–0.4)
EOS: 1 %
HEMATOCRIT: 42.5 % (ref 37.5–51.0)
HEMOGLOBIN: 14.9 g/dL (ref 13.0–17.7)
IMMATURE GRANS (ABS): 0 10*3/uL (ref 0.0–0.1)
IMMATURE GRANULOCYTES: 0 %
LYMPHS: 37 %
Lymphocytes Absolute: 2.6 10*3/uL (ref 0.7–3.1)
MCH: 31.1 pg (ref 26.6–33.0)
MCHC: 35.1 g/dL (ref 31.5–35.7)
MCV: 89 fL (ref 79–97)
MONOCYTES: 5 %
Monocytes Absolute: 0.4 10*3/uL (ref 0.1–0.9)
NEUTROS ABS: 3.9 10*3/uL (ref 1.4–7.0)
NEUTROS PCT: 56 %
PLATELETS: 301 10*3/uL (ref 150–379)
RBC: 4.79 x10E6/uL (ref 4.14–5.80)
RDW: 13.1 % (ref 12.3–15.4)
WBC: 6.9 10*3/uL (ref 3.4–10.8)

## 2017-08-24 LAB — LIPID PANEL
Chol/HDL Ratio: 5.4 ratio — ABNORMAL HIGH (ref 0.0–5.0)
Cholesterol, Total: 158 mg/dL (ref 100–199)
HDL: 29 mg/dL — AB (ref >39)
LDL Calculated: 90 mg/dL (ref 0–99)
Triglycerides: 193 mg/dL — ABNORMAL HIGH (ref 0–149)
VLDL Cholesterol Cal: 39 mg/dL (ref 5–40)

## 2017-08-24 LAB — PSA, TOTAL AND FREE
PSA, Free Pct: 21 %
PSA, Free: 0.21 ng/mL
Prostate Specific Ag, Serum: 1 ng/mL (ref 0.0–4.0)

## 2017-09-02 ENCOUNTER — Ambulatory Visit (AMBULATORY_SURGERY_CENTER): Payer: Self-pay

## 2017-09-02 VITALS — Ht 72.0 in | Wt 209.0 lb

## 2017-09-02 DIAGNOSIS — Z1211 Encounter for screening for malignant neoplasm of colon: Secondary | ICD-10-CM

## 2017-09-02 MED ORDER — NA SULFATE-K SULFATE-MG SULF 17.5-3.13-1.6 GM/177ML PO SOLN: 1.0000 | Freq: Once | ORAL | 0 refills | Status: AC

## 2017-09-02 NOTE — Progress Notes (Signed)
Per pt, no allergies to soy or egg products.Pt not taking any weight loss meds or using  O2 at home.  Pt refused emmi video. 

## 2017-09-09 ENCOUNTER — Encounter: Payer: Self-pay | Admitting: Gastroenterology

## 2017-09-17 ENCOUNTER — Encounter: Payer: Self-pay | Admitting: Gastroenterology

## 2017-09-17 ENCOUNTER — Other Ambulatory Visit: Payer: Self-pay

## 2017-09-17 ENCOUNTER — Ambulatory Visit (AMBULATORY_SURGERY_CENTER): Payer: Managed Care, Other (non HMO) | Admitting: Gastroenterology

## 2017-09-17 VITALS — BP 121/75 | HR 62 | Temp 98.9°F | Resp 19 | Ht 72.0 in | Wt 209.0 lb

## 2017-09-17 DIAGNOSIS — Z1211 Encounter for screening for malignant neoplasm of colon: Secondary | ICD-10-CM

## 2017-09-17 DIAGNOSIS — Z1212 Encounter for screening for malignant neoplasm of rectum: Secondary | ICD-10-CM

## 2017-09-17 MED ORDER — SODIUM CHLORIDE 0.9 % IV SOLN: 500.0000 mL | Freq: Once | INTRAVENOUS | Status: DC

## 2017-09-17 NOTE — Op Note (Signed)
[facility] Patient Name: Levi Washington Procedure Date: 09/17/2017 1:37 PM MRN: [id number] Endoscopist: Sherilyn Cooter L. Myrtie Neither , MD Age: 52 Referring MD:  Date of Birth: [birth year] Gender: Male Account #: [id number] Procedure:                Colonoscopy Indications:              Screening for colorectal malignant neoplasm, This                            is the patient's first colonoscopy Medicines:                Monitored Anesthesia Care Procedure:                Pre-Anesthesia Assessment:                           - Prior to the procedure, a History and Physical                            was performed, and patient medications and                            allergies were reviewed. The patient's tolerance of                            previous anesthesia was also reviewed. The risks                            and benefits of the procedure and the sedation                            options and risks were discussed with the patient.                            All questions were answered, and informed consent                            was obtained. Prior Anticoagulants: The patient has                            taken no previous anticoagulant or antiplatelet                            agents. ASA Grade Assessment: I - A normal, healthy                            patient. After reviewing the risks and benefits,                            the patient was deemed in satisfactory condition to                            undergo the procedure.  After obtaining informed consent, the colonoscope                            was passed under direct vision. Throughout the                            procedure, the patient's blood pressure, pulse, and                            oxygen saturations were monitored continuously. The                            Colonoscope was introduced through the anus and                            advanced to the the cecum, identified  by                            appendiceal orifice and ileocecal valve. The                            colonoscopy was performed without difficulty. The                            patient tolerated the procedure well. The quality                            of the bowel preparation was excellent. The                            ileocecal valve, appendiceal orifice, and rectum                            were photographed. The quality of the bowel                            preparation was evaluated using the BBPS [facility]                            Bowel Preparation Scale) with scores of: Right                            Colon = 3, Transverse Colon = 3 and Left Colon = 3                            (entire mucosa seen well with no residual staining,                            small fragments of stool or opaque liquid). The                            total BBPS score equals 9. The bowel preparation  used was SUPREP. Scope In: 1:46:42 PM Scope Out: 1:58:01 PM Scope Withdrawal Time: 0 hours 9 minutes 5 seconds  Total Procedure Duration: 0 hours 11 minutes 19 seconds  Findings:                 The perianal and digital rectal examinations were                            normal.                           Multiple small-mouthed diverticula were found in                            the left colon.                           The exam was otherwise without abnormality on                            direct and retroflexion views. Complications:            No immediate complications. Estimated Blood Loss:     Estimated blood loss: none. Impression:               - Diverticulosis in the left colon.                           - The examination was otherwise normal on direct                            and retroflexion views.                           - No specimens collected. Recommendation:           - Patient has a contact number available for                            emergencies.  The signs and symptoms of potential                            delayed complications were discussed with the                            patient. Return to normal activities tomorrow.                            Written discharge instructions were provided to the                            patient.                           - Resume previous diet.                           - Continue present medications.                           -  Repeat colonoscopy in 10 years for screening                            purposes. Henry L. Myrtie Neither, MD 09/17/2017 2:00:23 PM This report has been signed electronically.

## 2017-09-17 NOTE — Patient Instructions (Signed)
YOU HAD AN ENDOSCOPIC PROCEDURE TODAY AT THE [facility]:   Refer to the procedure report that was given to you for any specific questions about what was found during the examination.  If the procedure report does not answer your questions, please call your gastroenterologist to clarify.  If you requested that your care partner not be given the details of your procedure findings, then the procedure report has been included in a sealed envelope for you to review at your convenience later.  YOU SHOULD EXPECT: Some feelings of bloating in the abdomen. Passage of more gas than usual.  Walking can help get rid of the air that was put into your GI tract during the procedure and reduce the bloating. If you had a lower endoscopy (such as a colonoscopy or flexible sigmoidoscopy) you may notice spotting of blood in your stool or on the toilet paper. If you underwent a bowel prep for your procedure, you may not have a normal bowel movement for a few days.  Please Note:  You might notice some irritation and congestion in your nose or some drainage.  This is from the oxygen used during your procedure.  There is no need for concern and it should clear up in a day or so.  SYMPTOMS TO REPORT IMMEDIATELY:   Following lower endoscopy (colonoscopy or flexible sigmoidoscopy):  Excessive amounts of blood in the stool  Significant tenderness or worsening of abdominal pains  Swelling of the abdomen that is new, acute  Fever of 100F or higher  For urgent or emergent issues, a gastroenterologist can be reached at any hour by calling [phone].   DIET:  We do recommend a small meal at first, but then you may proceed to your regular diet.  Drink plenty of fluids but you should avoid alcoholic beverages for 24 hours.  MEDICATIONS: Continue present medications.  Please see handouts given to you by your recovery nurse.  ACTIVITY:  You should plan to take it easy for the rest of today and you should NOT  DRIVE or use heavy machinery until tomorrow (because of the sedation medicines used during the test).    FOLLOW UP: Our staff will call the number listed on your records the next business day following your procedure to check on you and address any questions or concerns that you may have regarding the information given to you following your procedure. If we do not reach you, we will leave a message.  However, if you are feeling well and you are not experiencing any problems, there is no need to return our call.  We will assume that you have returned to your regular daily activities without incident.  If any biopsies were taken you will be contacted by phone or by letter within the next 1-3 weeks.  Please call us at [phone] if you have not heard about the biopsies in 3 weeks.   Thank you for allowing us to provide for your healthcare needs today.   SIGNATURES/CONFIDENTIALITY: You and/or your care partner have signed paperwork which will be entered into your electronic medical record.  These signatures attest to the fact that that the information above on your After Visit Summary has been reviewed and is understood.  Full responsibility of the confidentiality of this discharge information lies with you and/or your care-partner. 

## 2017-09-20 ENCOUNTER — Telehealth: Payer: Self-pay | Admitting: *Deleted

## 2017-09-20 NOTE — Telephone Encounter (Signed)
  Follow up Call-  Call back number 09/17/2017  Post procedure Call Back phone  # [phone]  Permission to leave phone message Yes  Some recent data might be hidden     Patient questions:  Do you have a fever, pain , or abdominal swelling? No. Pain Score  0 *  Have you tolerated food without any problems? Yes.    Have you been able to return to your normal activities? Yes.    Do you have any questions about your discharge instructions: Diet   No. Medications  No. Follow up visit  No.  Do you have questions or concerns about your Care? No.  Actions: * If pain score is 4 or above: No action needed, pain <4.

## 2017-10-07 ENCOUNTER — Encounter: Payer: Self-pay | Admitting: Nurse Practitioner

## 2017-10-07 ENCOUNTER — Ambulatory Visit: Payer: Self-pay | Admitting: Nurse Practitioner

## 2017-10-07 VITALS — BP 127/78 | HR 69 | Temp 97.0°F | Ht 72.0 in | Wt 208.0 lb

## 2017-10-07 DIAGNOSIS — M5441 Lumbago with sciatica, right side: Secondary | ICD-10-CM

## 2017-10-07 MED ORDER — CYCLOBENZAPRINE HCL 10 MG PO TABS: 10.0000 mg | ORAL_TABLET | Freq: Three times a day (TID) | ORAL | 1 refills | Status: DC | PRN

## 2017-10-07 MED ORDER — NAPROXEN 500 MG PO TABS: 500.0000 mg | ORAL_TABLET | Freq: Two times a day (BID) | ORAL | 1 refills | Status: DC

## 2017-10-07 NOTE — Patient Instructions (Signed)

## 2017-10-07 NOTE — Progress Notes (Signed)
   Subjective:    Patient ID: Levi Washington, male    DOB: 1966/02/18, 52 y.o.   MRN: [id number]  HPI Levi ClanRichard A Mast comes in today for workers comp injury.  DATE OF INJURY: 10/06/17   EMPLOYER: [employer]  EXPLANATION OF INJURY:  Was lifting some batteries and when he turned he felt a pain in lower back and down leg. Was hurting more this morning. Had trouble bending over and tying his shoes. Rates pain 6-7/10 currently. sitting increases pain and standing helps pain. He used eatong pad last night which really helped     Review of Systems  Constitutional: Negative for activity change and appetite change.  HENT: Negative.   Eyes: Negative for pain.  Respiratory: Negative for shortness of breath.   Cardiovascular: Negative for chest pain, palpitations and leg swelling.  Gastrointestinal: Negative for abdominal pain.  Endocrine: Negative for polydipsia.  Genitourinary: Negative.   Skin: Negative for rash.  Neurological: Negative for dizziness, weakness and headaches.  Hematological: Does not bruise/bleed easily.  Psychiatric/Behavioral: Negative.   All other systems reviewed and are negative.      Objective:   Physical Exam  Constitutional: He is oriented to person, place, and time. He appears well-developed and well-nourished. No distress.  Cardiovascular: Normal rate and regular rhythm.  Pulmonary/Chest: Effort normal and breath sounds normal.  Musculoskeletal:  Decrease ROM of lumber spine with pain on flexion and extension. Pain radiates down back right leg No point tenderness Motor strength and sensation distally iintact (+) SLR on right  Neurological: He is alert and oriented to person, place, and time. He has normal reflexes. No cranial nerve deficit.  Psychiatric: He has a normal mood and affect. His behavior is normal. Judgment and thought content normal.    BP 127/78   Pulse 69   Temp (!) 97 F (36.1 C) (Oral)   Ht 6' (1.829 m)   Wt 208 lb (94.3 kg)    BMI 28.21 kg/m        Assessment & Plan:  1. Acute right-sided low back pain with right-sided sciatica Moist heat Rest Avoid lifting for 2-3 days  RTO prn - naproxen (NAPROSYN) 500 MG tablet; Take 1 tablet (500 mg total) by mouth 2 (two) times daily with a meal.  Dispense: 60 tablet; Refill: 1 - cyclobenzaprine (FLEXERIL) 10 MG tablet; Take 1 tablet (10 mg total) by mouth 3 (three) times daily as needed for muscle spasms.  Dispense: 30 tablet; Refill: 1   Mary-Margaret Daphine DeutscherMartin, FNP

## 2019-08-16 ENCOUNTER — Telehealth: Payer: Self-pay | Admitting: Nurse Practitioner

## 2019-08-16 NOTE — Telephone Encounter (Signed)
Apt scheduled with pcp.

## 2019-08-21 ENCOUNTER — Other Ambulatory Visit: Payer: Self-pay

## 2019-08-22 ENCOUNTER — Encounter: Payer: Self-pay | Admitting: Nurse Practitioner

## 2019-08-22 ENCOUNTER — Ambulatory Visit (INDEPENDENT_AMBULATORY_CARE_PROVIDER_SITE_OTHER): Payer: Self-pay | Admitting: Nurse Practitioner

## 2019-08-22 VITALS — BP 130/75 | HR 78 | Temp 98.4°F | Resp 20 | Ht 72.0 in | Wt 201.0 lb

## 2019-08-22 DIAGNOSIS — K4090 Unilateral inguinal hernia, without obstruction or gangrene, not specified as recurrent: Secondary | ICD-10-CM

## 2019-08-22 NOTE — Progress Notes (Signed)
   Subjective:    Patient ID: Levi Washington, male    DOB: [birth year], 54 y.o.   MRN: [id number]   Chief Complaint: right groin pain   HPI Patient comes in today c/o right groin pain. Started several months ago. Area bulges out at times and that is when it hurts the most.    Review of Systems  Constitutional: Negative for diaphoresis.  Eyes: Negative for pain.  Respiratory: Negative for shortness of breath.   Cardiovascular: Negative for chest pain, palpitations and leg swelling.  Gastrointestinal: Negative for abdominal pain.  Endocrine: Negative for polydipsia.  Genitourinary: Negative for discharge, penile pain, penile swelling, scrotal swelling and testicular pain.  Skin: Negative for rash.  Neurological: Negative for dizziness, weakness and headaches.  Hematological: Does not bruise/bleed easily.  All other systems reviewed and are negative.      Objective:   Physical Exam Vitals and nursing note reviewed.  Constitutional:      Appearance: Normal appearance.  Cardiovascular:     Rate and Rhythm: Normal rate and regular rhythm.     Pulses: Normal pulses.     Heart sounds: Normal heart sounds.  Pulmonary:     Breath sounds: Normal breath sounds.  Abdominal:     General: Abdomen is flat.     Palpations: Abdomen is soft.     Hernia: A hernia (right inguinal area with pain on palpation) is present.  Skin:    General: Skin is warm and dry.  Neurological:     General: No focal deficit present.     Mental Status: He is alert.  Psychiatric:        Mood and Affect: Mood normal.        Behavior: Behavior normal.    BP 130/75   Pulse 78   Temp 98.4 F (36.9 C)   Resp 20   Ht 6' (1.829 m)   Wt 201 lb (91.2 kg)   SpO2 98%   BMI 27.26 kg/m         Assessment & Plan:  Levi Washington in today with chief complaint of right groin pain   1. Right inguinal hernia Try to avoid heavy lifting If pain is unbearable need to go to ER - Ambulatory referral to  General Surgery  Mary-Margaret Daphine Deutscher, FNP

## 2019-08-22 NOTE — Patient Instructions (Signed)
° °Inguinal Hernia, Adult °An inguinal hernia is when fat or your intestines push through a weak spot in a muscle where your leg meets your lower belly (groin). This causes a rounded lump (bulge). This kind of hernia could also be: °· In your scrotum, if you are male. °· In folds of skin around your vagina, if you are male. °There are three types of inguinal hernias. These include: °· Hernias that can be pushed back into the belly (are reducible). This type rarely causes pain. °· Hernias that cannot be pushed back into the belly (are incarcerated). °· Hernias that cannot be pushed back into the belly and lose their blood supply (are strangulated). This type needs emergency surgery. °If you do not have symptoms, you may not need treatment. If you have symptoms or a large hernia, you may need surgery. °Follow these instructions at home: °Lifestyle °· Do these things if told by your doctor so you do not have trouble pooping (constipation): °? Drink enough fluid to keep your pee (urine) pale yellow. °? Eat foods that have a lot of fiber. These include fresh fruits and vegetables, whole grains, and beans. °? Limit foods that are high in fat and processed sugars. These include foods that are fried or sweet. °? Take medicine for trouble pooping. °· Avoid lifting heavy objects. °· Avoid standing for long amounts of time. °· Do not use any products that contain nicotine or tobacco. These include cigarettes and e-cigarettes. If you need help quitting, ask your doctor. °· Stay at a healthy weight. °General instructions °· You may try to push your hernia in by very gently pressing on it when you are lying down. Do not try to force the bulge back in if it will not push in easily. °· Watch your hernia for any changes in shape, size, or color. Tell your doctor if you see any changes. °· Take over-the-counter and prescription medicines only as told by your doctor. °· Keep all follow-up visits as told by your doctor. This is  important. °Contact a doctor if: °· You have a fever. °· You have new symptoms. °· Your symptoms get worse. °Get help right away if: °· The area where your leg meets your lower belly has: °? Pain that gets worse suddenly. °? A bulge that gets bigger suddenly, and it does not get smaller after that. °? A bulge that turns red or purple. °? A bulge that is painful when you touch it. °· You are a man, and your scrotum: °? Suddenly feels painful. °? Suddenly changes in size. °· You cannot push the hernia in by very gently pressing on it when you are lying down. Do not try to force the bulge back in if it will not push in easily. °· You feel sick to your stomach (nauseous), and that feeling does not go away. °· You throw up (vomit), and that keeps happening. °· You have a fast heartbeat. °· You cannot poop (have a bowel movement) or pass gas. °These symptoms may be an emergency. Do not wait to see if the symptoms will go away. Get medical help right away. Call your local emergency services (911 in the [place] °Summary °· An inguinal hernia is when fat or your intestines push through a weak spot in a muscle where your leg meets your lower belly (groin). This causes a rounded lump (bulge). °· If you do not have symptoms, you may not need treatment. If you have symptoms or a large hernia,   you may need surgery. °· Avoid lifting heavy objects. Also avoid standing for long amounts of time. °· Do not try to force the bulge back in if it will not push in easily. °This information is not intended to replace advice given to you by your health care provider. Make sure you discuss any questions you have with your health care provider. °Document Revised: 09/04/2017 Document Reviewed: 05/05/2017 °Elsevier Patient Education © 2020 [employer]vier Inc. ° °

## 2019-09-19 ENCOUNTER — Other Ambulatory Visit: Payer: Self-pay | Admitting: Surgery

## 2020-05-29 ENCOUNTER — Other Ambulatory Visit: Payer: Self-pay

## 2020-05-29 DIAGNOSIS — Z20822 Contact with and (suspected) exposure to covid-19: Secondary | ICD-10-CM

## 2020-05-30 LAB — SARS-COV-2, NAA 2 DAY TAT

## 2020-05-30 LAB — NOVEL CORONAVIRUS, NAA: SARS-CoV-2, NAA: NOT DETECTED

## 2020-07-26 ENCOUNTER — Encounter: Payer: Self-pay | Admitting: Nurse Practitioner

## 2020-07-26 ENCOUNTER — Ambulatory Visit (INDEPENDENT_AMBULATORY_CARE_PROVIDER_SITE_OTHER): Payer: Self-pay | Admitting: Nurse Practitioner

## 2020-07-26 DIAGNOSIS — Z5329 Procedure and treatment not carried out because of patient's decision for other reasons: Secondary | ICD-10-CM | POA: Insufficient documentation

## 2020-07-26 DIAGNOSIS — Z91199 Patient's noncompliance with other medical treatment and regimen due to unspecified reason: Secondary | ICD-10-CM

## 2021-11-13 ENCOUNTER — Ambulatory Visit (INDEPENDENT_AMBULATORY_CARE_PROVIDER_SITE_OTHER): Payer: No Typology Code available for payment source | Admitting: Nurse Practitioner

## 2021-11-13 ENCOUNTER — Ambulatory Visit (INDEPENDENT_AMBULATORY_CARE_PROVIDER_SITE_OTHER): Payer: No Typology Code available for payment source

## 2021-11-13 ENCOUNTER — Encounter: Payer: Self-pay | Admitting: Nurse Practitioner

## 2021-11-13 VITALS — BP 114/73 | HR 71 | Temp 97.7°F | Resp 20 | Ht 72.0 in | Wt 194.0 lb

## 2021-11-13 DIAGNOSIS — Z0001 Encounter for general adult medical examination with abnormal findings: Secondary | ICD-10-CM | POA: Diagnosis not present

## 2021-11-13 DIAGNOSIS — Z6826 Body mass index (BMI) 26.0-26.9, adult: Secondary | ICD-10-CM | POA: Diagnosis not present

## 2021-11-13 DIAGNOSIS — Z0000 Encounter for general adult medical examination without abnormal findings: Secondary | ICD-10-CM | POA: Diagnosis not present

## 2021-11-13 DIAGNOSIS — R0981 Nasal congestion: Secondary | ICD-10-CM

## 2021-11-13 MED ORDER — FLUTICASONE PROPIONATE 50 MCG/ACT NA SUSP: 2.0000 | Freq: Every day | NASAL | 6 refills | Status: DC

## 2021-11-13 NOTE — Patient Instructions (Signed)
Exercising to Stay Healthy °To become healthy and stay healthy, it is recommended that you do moderate-intensity and vigorous-intensity exercise. You can tell that you are exercising at a moderate intensity if your heart starts beating faster and you start breathing faster but can still hold a conversation. You can tell that you are exercising at a vigorous intensity if you are breathing much harder and faster and cannot hold a conversation while exercising. °How can exercise benefit me? °Exercising regularly is important. It has many health benefits, such as: °Improving overall fitness, flexibility, and endurance. °Increasing bone density. °Helping with weight control. °Decreasing body fat. °Increasing muscle strength and endurance. °Reducing stress and tension, anxiety, depression, or anger. °Improving overall health. °What guidelines should I follow while exercising? °Before you start a new exercise program, talk with your health care provider. °Do not exercise so much that you hurt yourself, feel dizzy, or get very short of breath. °Wear comfortable clothes and wear shoes with good support. °Drink plenty of water while you exercise to prevent dehydration or heat stroke. °Work out until your breathing and your heartbeat get faster (moderate intensity). °How often should I exercise? °Choose an activity that you enjoy, and set realistic goals. Your health care provider can help you make an activity plan that is individually designed and works best for you. °Exercise regularly as told by your health care provider. This may include: °Doing strength training two times a week, such as: °Lifting weights. °Using resistance bands. °Push-ups. °Sit-ups. °Yoga. °Doing a certain intensity of exercise for a given amount of time. Choose from these options: °A total of 150 minutes of moderate-intensity exercise every week. °A total of 75 minutes of vigorous-intensity exercise every week. °A mix of moderate-intensity and  vigorous-intensity exercise every week. °Children, pregnant women, people who have not exercised regularly, people who are overweight, and older adults may need to talk with a health care provider about what activities are safe to perform. If you have a medical condition, be sure to talk with your health care provider before you start a new exercise program. °What are some exercise ideas? °Moderate-intensity exercise ideas include: °Walking 1 mile (1.6 km) in about 15 minutes. °Biking. °Hiking. °Golfing. °Dancing. °Water aerobics. °Vigorous-intensity exercise ideas include: °Walking 4.5 miles (7.2 km) or more in about 1 hour. °Jogging or running 5 miles (8 km) in about 1 hour. °Biking 10 miles (16.1 km) or more in about 1 hour. °Lap swimming. °Roller-skating or in-line skating. °Cross-country skiing. °Vigorous competitive sports, such as football, basketball, and soccer. °Jumping rope. °Aerobic dancing. °What are some everyday activities that can help me get exercise? °Yard work, such as: °Pushing a lawn mower. °Raking and bagging leaves. °Washing your car. °Pushing a stroller. °Shoveling snow. °Gardening. °Washing windows or floors. °How can I be more active in my day-to-day activities? °Use stairs instead of an elevator. °Take a walk during your lunch break. °If you drive, park your car farther away from your work or school. °If you take public transportation, get off one stop early and walk the rest of the way. °Stand up or walk around during all of your indoor phone calls. °Get up, stretch, and walk around every 30 minutes throughout the day. °Enjoy exercise with a friend. Support to continue exercising will help you keep a regular routine of activity. °Where to find more information °You can find more information about exercising to stay healthy from: °[place] Department of Health and Human Services: www.hhs.gov °Centers for Disease Control and Prevention (  CDC): www.cdc.gov °Summary °Exercising regularly is  important. It will improve your overall fitness, flexibility, and endurance. °Regular exercise will also improve your overall health. It can help you control your weight, reduce stress, and improve your bone density. °Do not exercise so much that you hurt yourself, feel dizzy, or get very short of breath. °Before you start a new exercise program, talk with your health care provider. °This information is not intended to replace advice given to you by your health care provider. Make sure you discuss any questions you have with your health care provider. °Document Revised: 11/29/2020 Document Reviewed: 11/29/2020 °Elsevier Patient Education © 2022 Elsevier Inc. ° °

## 2021-11-13 NOTE — Progress Notes (Signed)
? ?Subjective:  ? ? Patient ID: Levi Washington, male    DOB: 1965/12/05, 56 y.o.   MRN: [id number] ? ?Chief Complaint: Annual Exam ?  ? ?HPI: ? ?Levi Washington is a 56 y.o. who identifies as a male who was assigned male at birth.  ? ?Social history: ?Lives with: wife ?Work history: Research scientist (physical sciences) ? ? ?Comes in today for follow up of the following chronic medical issues: ? ?1. Annual physical exam ?Patient in today for annual physical exam. He is on no medications and has no chronic medical problems.  ? ?2. BMI 270.-27.9 ?Weight is down 7lbs ?Wt Readings from Last 3 Encounters:  ?11/13/21 194 lb (88 kg)  ?08/22/19 201 lb (91.2 kg)  ?10/07/17 208 lb (94.3 kg)  ? ?BMI Readings from Last 3 Encounters:  ?11/13/21 26.31 kg/m?  ?08/22/19 27.26 kg/m?  ?10/07/17 28.21 kg/m?  ? ? ?New complaints: ?Sinus congestion- has used saline and afrin ? ?No Known Allergies ?Outpatient Encounter Medications as of 11/13/2021  ?Medication Sig  ? [DISCONTINUED] oxyCODONE-acetaminophen (PERCOCET) 10-325 MG tablet Take 1 tablet by mouth 2 (two) times daily.  ? ?No facility-administered encounter medications on file as of 11/13/2021.  ? ? ?Past Surgical History:  ?Procedure Laterality Date  ? CHOLECYSTECTOMY  2014  ? eye sugery Left   ? TONSILLECTOMY  2010  ? ? ?Family History  ?Problem Relation Age of Onset  ? Hypertension Mother   ? Stroke Father   ? Hiatal hernia Father   ? ? ? ? ?Controlled substance contract: n/a ? ? ? ? ?Review of Systems  ?Constitutional:  Negative for diaphoresis.  ?Eyes:  Negative for pain.  ?Respiratory:  Negative for shortness of breath.   ?Cardiovascular:  Negative for chest pain, palpitations and leg swelling.  ?Gastrointestinal:  Negative for abdominal pain.  ?Endocrine: Negative for polydipsia.  ?Skin:  Negative for rash.  ?Neurological:  Negative for dizziness, weakness and headaches.  ?Hematological:  Does not bruise/bleed easily.  ?All other systems reviewed and are negative. ? ?   ?Objective:  ?  Physical Exam ?Vitals and nursing note reviewed.  ?Constitutional:   ?   Appearance: Normal appearance. He is well-developed.  ?HENT:  ?   Head: Normocephalic.  ?   Nose: Nose normal.  ?   Mouth/Throat:  ?   Mouth: Mucous membranes are moist.  ?   Pharynx: Oropharynx is clear.  ?Eyes:  ?   Pupils: Pupils are equal, round, and reactive to light.  ?Neck:  ?   Thyroid: No thyroid mass or thyromegaly.  ?   Vascular: No carotid bruit or JVD.  ?   Trachea: Phonation normal.  ?Cardiovascular:  ?   Rate and Rhythm: Normal rate and regular rhythm.  ?Pulmonary:  ?   Effort: Pulmonary effort is normal. No respiratory distress.  ?   Breath sounds: Normal breath sounds.  ?Abdominal:  ?   General: Bowel sounds are normal.  ?   Palpations: Abdomen is soft.  ?   Tenderness: There is no abdominal tenderness.  ?Musculoskeletal:     ?   General: Normal range of motion.  ?   Cervical back: Normal range of motion and neck supple.  ?Lymphadenopathy:  ?   Cervical: No cervical adenopathy.  ?Skin: ?   General: Skin is warm and dry.  ?Neurological:  ?   Mental Status: He is alert and oriented to person, place, and time.  ?Psychiatric:     ?   Behavior: Behavior normal.     ?  Thought Content: Thought content normal.     ?   Judgment: Judgment normal.  ? ? ?BP 114/73   Pulse 71   Temp 97.7 ?F (36.5 ?C) (Temporal)   Resp 20   Ht 6' (1.829 m)   Wt 194 lb (88 kg)   SpO2 97%   BMI 26.31 kg/m?  ? ?EKG= NSR-Mary-Margaret Hassell Done, FNP ?Chest xray normal-Preliminary reading by Ronnald Collum, FNP  [facility] ? ? ?   ?Assessment & Plan:  ?Levi Washington comes in today with chief complaint of Annual Exam ? ? ?Diagnosis and orders addressed: ? ?1. Annual physical exam ?Labs pending ?- CBC with Differential/Platelet ?- CMP14+EGFR ?- Lipid panel ?- PSA, total and free ?- EKG 12-Lead ?- DG Chest 2 View ? ?2. BMI 26.0-26.9,adult ?Discussed diet and exercise for person with BMI >25 ?Will recheck weight in 3-6 months ? ? ?3. Nasal congestion ?OTC  decongestant ?- fluticasone (FLONASE) 50 MCG/ACT nasal spray; Place 2 sprays into both nostrils daily.  Dispense: 16 g; Refill: 6 ? ? ?Labs pending ?Health Maintenance reviewed ?Diet and exercise encouraged ? ?Follow up plan: ?1 year ? ? ?Mary-Margaret Hassell Done, FNP ? ? ?

## 2021-11-14 LAB — CBC WITH DIFFERENTIAL/PLATELET
Basophils Absolute: 0.1 10*3/uL (ref 0.0–0.2)
Basos: 1 %
EOS (ABSOLUTE): 0.3 10*3/uL (ref 0.0–0.4)
Eos: 4 %
Hematocrit: 43.3 % (ref 37.5–51.0)
Hemoglobin: 14.9 g/dL (ref 13.0–17.7)
Immature Grans (Abs): 0 10*3/uL (ref 0.0–0.1)
Immature Granulocytes: 0 %
Lymphocytes Absolute: 3.3 10*3/uL — ABNORMAL HIGH (ref 0.7–3.1)
Lymphs: 48 %
MCH: 30.7 pg (ref 26.6–33.0)
MCHC: 34.4 g/dL (ref 31.5–35.7)
MCV: 89 fL (ref 79–97)
Monocytes Absolute: 0.5 10*3/uL (ref 0.1–0.9)
Monocytes: 7 %
Neutrophils Absolute: 2.7 10*3/uL (ref 1.4–7.0)
Neutrophils: 40 %
Platelets: 305 10*3/uL (ref 150–450)
RBC: 4.86 x10E6/uL (ref 4.14–5.80)
RDW: 12.5 % (ref 11.6–15.4)
WBC: 6.9 10*3/uL (ref 3.4–10.8)

## 2021-11-14 LAB — CMP14+EGFR
ALT: 17 IU/L (ref 0–44)
AST: 13 IU/L (ref 0–40)
Albumin/Globulin Ratio: 1.6 (ref 1.2–2.2)
Albumin: 4.4 g/dL (ref 3.8–4.9)
Alkaline Phosphatase: 85 IU/L (ref 44–121)
BUN/Creatinine Ratio: 18 (ref 9–20)
BUN: 17 mg/dL (ref 6–24)
Bilirubin Total: 0.4 mg/dL (ref 0.0–1.2)
CO2: 26 mmol/L (ref 20–29)
Calcium: 9.1 mg/dL (ref 8.7–10.2)
Chloride: 103 mmol/L (ref 96–106)
Creatinine, Ser: 0.97 mg/dL (ref 0.76–1.27)
Globulin, Total: 2.8 g/dL (ref 1.5–4.5)
Glucose: 106 mg/dL — ABNORMAL HIGH (ref 70–99)
Potassium: 4.3 mmol/L (ref 3.5–5.2)
Sodium: 142 mmol/L (ref 134–144)
Total Protein: 7.2 g/dL (ref 6.0–8.5)
eGFR: 92 mL/min/{1.73_m2} (ref >59)

## 2021-11-14 LAB — LIPID PANEL
Chol/HDL Ratio: 6.5 ratio — ABNORMAL HIGH (ref 0.0–5.0)
Cholesterol, Total: 157 mg/dL (ref 100–199)
HDL: 24 mg/dL — ABNORMAL LOW (ref >39)
LDL Chol Calc (NIH): 80 mg/dL (ref 0–99)
Triglycerides: 325 mg/dL — ABNORMAL HIGH (ref 0–149)
VLDL Cholesterol Cal: 53 mg/dL — ABNORMAL HIGH (ref 5–40)

## 2021-11-14 LAB — PSA, TOTAL AND FREE
PSA, Free Pct: 14.7 %
PSA, Free: 0.22 ng/mL
Prostate Specific Ag, Serum: 1.5 ng/mL (ref 0.0–4.0)

## 2022-04-09 ENCOUNTER — Ambulatory Visit (INDEPENDENT_AMBULATORY_CARE_PROVIDER_SITE_OTHER): Payer: No Typology Code available for payment source | Admitting: Nurse Practitioner

## 2022-04-09 ENCOUNTER — Encounter: Payer: Self-pay | Admitting: Nurse Practitioner

## 2022-04-09 VITALS — BP 136/79 | HR 72 | Temp 97.7°F | Resp 20 | Ht 72.0 in | Wt 197.0 lb

## 2022-04-09 DIAGNOSIS — M545 Low back pain, unspecified: Secondary | ICD-10-CM

## 2022-04-09 DIAGNOSIS — G8929 Other chronic pain: Secondary | ICD-10-CM | POA: Diagnosis not present

## 2022-04-09 NOTE — Progress Notes (Signed)
   Subjective:    Patient ID: Levi Washington, male    DOB: 1966/07/09, 56 y.o.   MRN: [id number]   Chief Complaint: Discuss back   HPI Patient has been having back pain for some time now. He has been seeing a specialist. He was given options and he wants to discuss.     Review of Systems  Constitutional:  Negative for diaphoresis.  Eyes:  Negative for pain.  Respiratory:  Negative for shortness of breath.   Cardiovascular:  Negative for chest pain, palpitations and leg swelling.  Gastrointestinal:  Negative for abdominal pain.  Endocrine: Negative for polydipsia.  Skin:  Negative for rash.  Neurological:  Negative for dizziness, weakness and headaches.  Hematological:  Does not bruise/bleed easily.  All other systems reviewed and are negative.      Objective:   Physical Exam Vitals reviewed.  Constitutional:      Appearance: Normal appearance.  Cardiovascular:     Rate and Rhythm: Normal rate and regular rhythm.     Heart sounds: Normal heart sounds.  Pulmonary:     Breath sounds: Normal breath sounds.  Musculoskeletal:     Comments: Rises slowly from sitting to standing Gait so and steady  (-) SLR bil  Skin:    General: Skin is warm.  Neurological:     General: No focal deficit present.     Mental Status: He is alert and oriented to person, place, and time.  Psychiatric:        Mood and Affect: Mood normal.        Behavior: Behavior normal.    BP 136/79   Pulse 72   Temp 97.7 F (36.5 C) (Temporal)   Resp 20   Ht 6' (1.829 m)   Wt 197 lb (89.4 kg)   SpO2 99%   BMI 26.72 kg/m          Assessment & Plan:   Levi Washington in today with chief complaint of Discuss back   1. Chronic midline low back pain without sciatica Keep follow up with ortho Moist heat Rest RTO prn    The above assessment and management plan was discussed with the patient. The patient verbalized understanding of and has agreed to the management plan. Patient is  aware to call the clinic if symptoms persist or worsen. Patient is aware when to return to the clinic for a follow-up visit. Patient educated on when it is appropriate to go to the emergency department.   Mary-Margaret Daphine Deutscher, FNP

## 2022-05-19 ENCOUNTER — Ambulatory Visit: Payer: No Typology Code available for payment source | Admitting: Nurse Practitioner

## 2022-06-05 ENCOUNTER — Telehealth: Payer: Self-pay | Admitting: Nurse Practitioner

## 2022-06-05 ENCOUNTER — Other Ambulatory Visit: Payer: Self-pay | Admitting: Nurse Practitioner

## 2022-06-05 DIAGNOSIS — R0981 Nasal congestion: Secondary | ICD-10-CM

## 2022-06-05 MED ORDER — FLUTICASONE PROPIONATE 50 MCG/ACT NA SUSP: 2.0000 | Freq: Every day | NASAL | 1 refills | Status: DC

## 2022-06-05 NOTE — Telephone Encounter (Signed)
Refill sent.

## 2022-06-05 NOTE — Telephone Encounter (Signed)
  Prescription Request  06/05/2022  Is this a "Controlled Substance" medicine? no  Have you seen your PCP in the last 2 weeks? no  If YES, route message to pool  -  If NO, patient needs to be scheduled for appointment.  What is the name of the medication or equipment? fluticasone (FLONASE) 50 MCG/ACT nasal spray  Have you contacted your pharmacy to request a refill? yes   Which pharmacy would you like this sent to? [id number] - [place], [address]   Patient notified that their request is being sent to the clinical staff for review and that they should receive a response within 2 business days.

## 2023-01-30 IMAGING — DX DG CHEST 2V
2 series · 2 of 2 positions shown · non-contrast
Comparison: July 30, 2004

CLINICAL DATA: Screening.

EXAM:
CHEST - 2 VIEW

[chest lat]
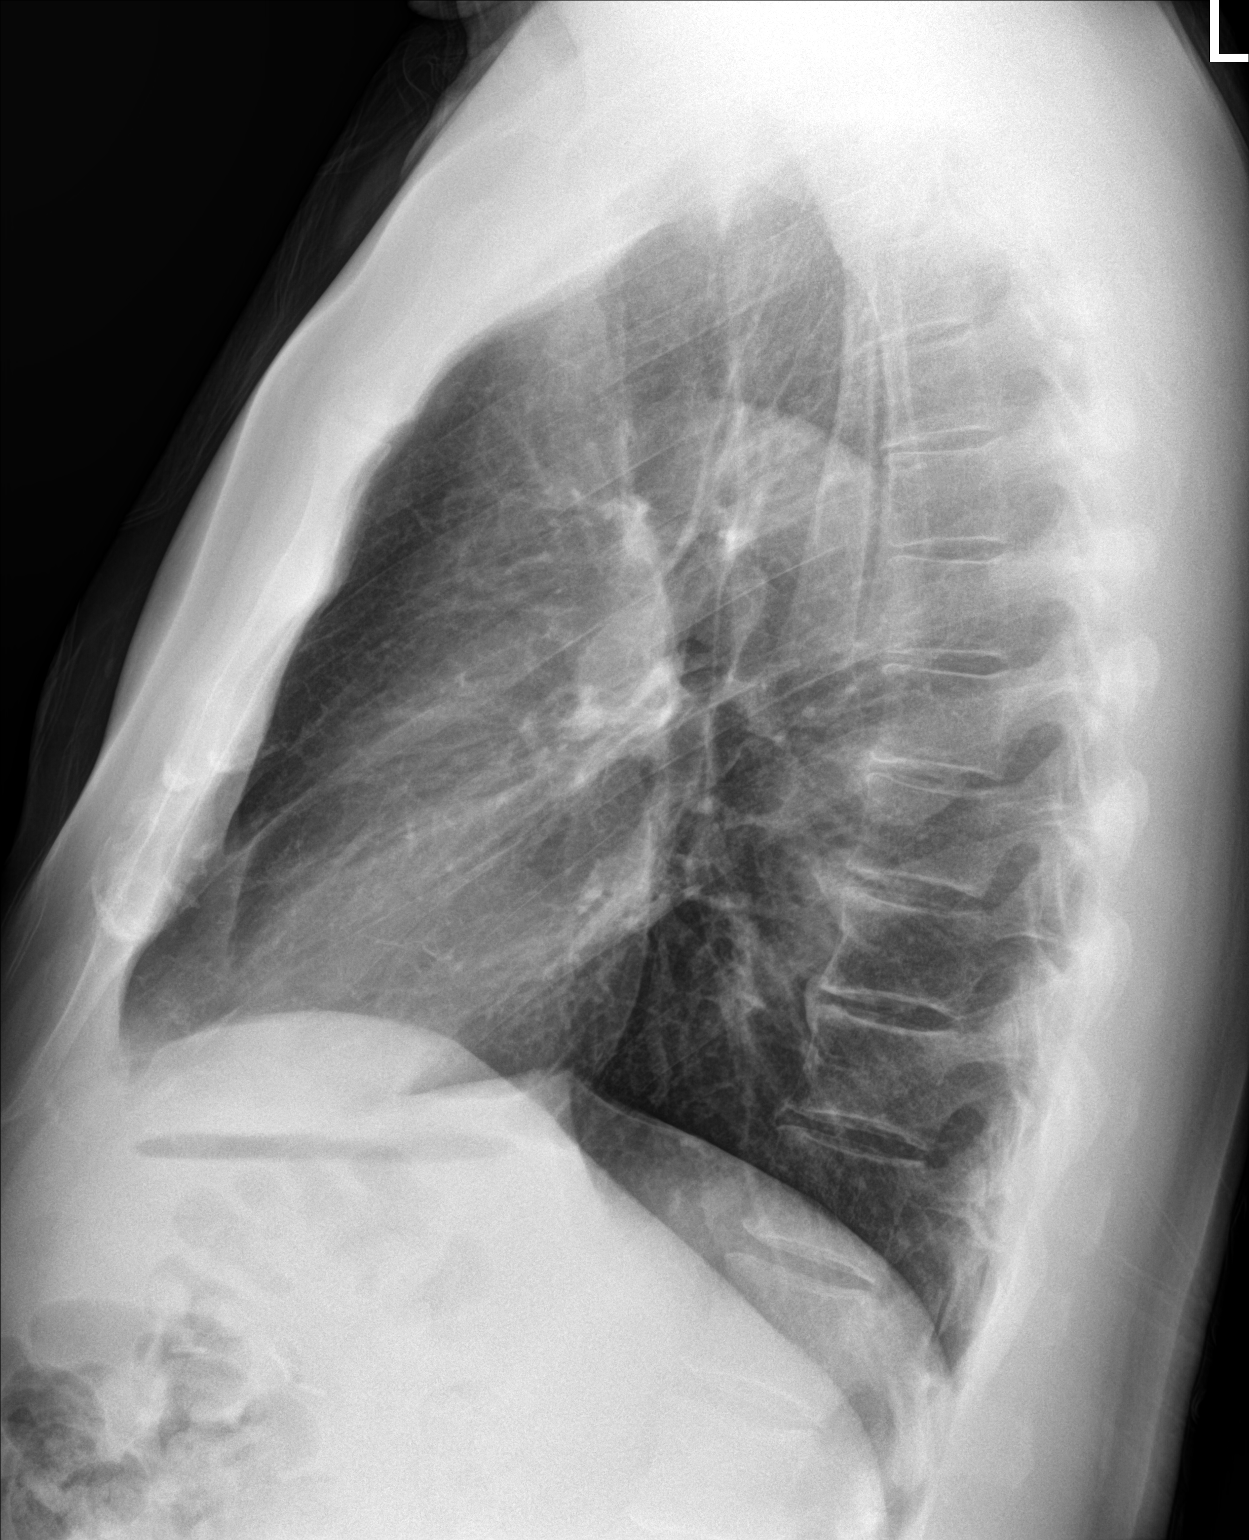

[chest pa]
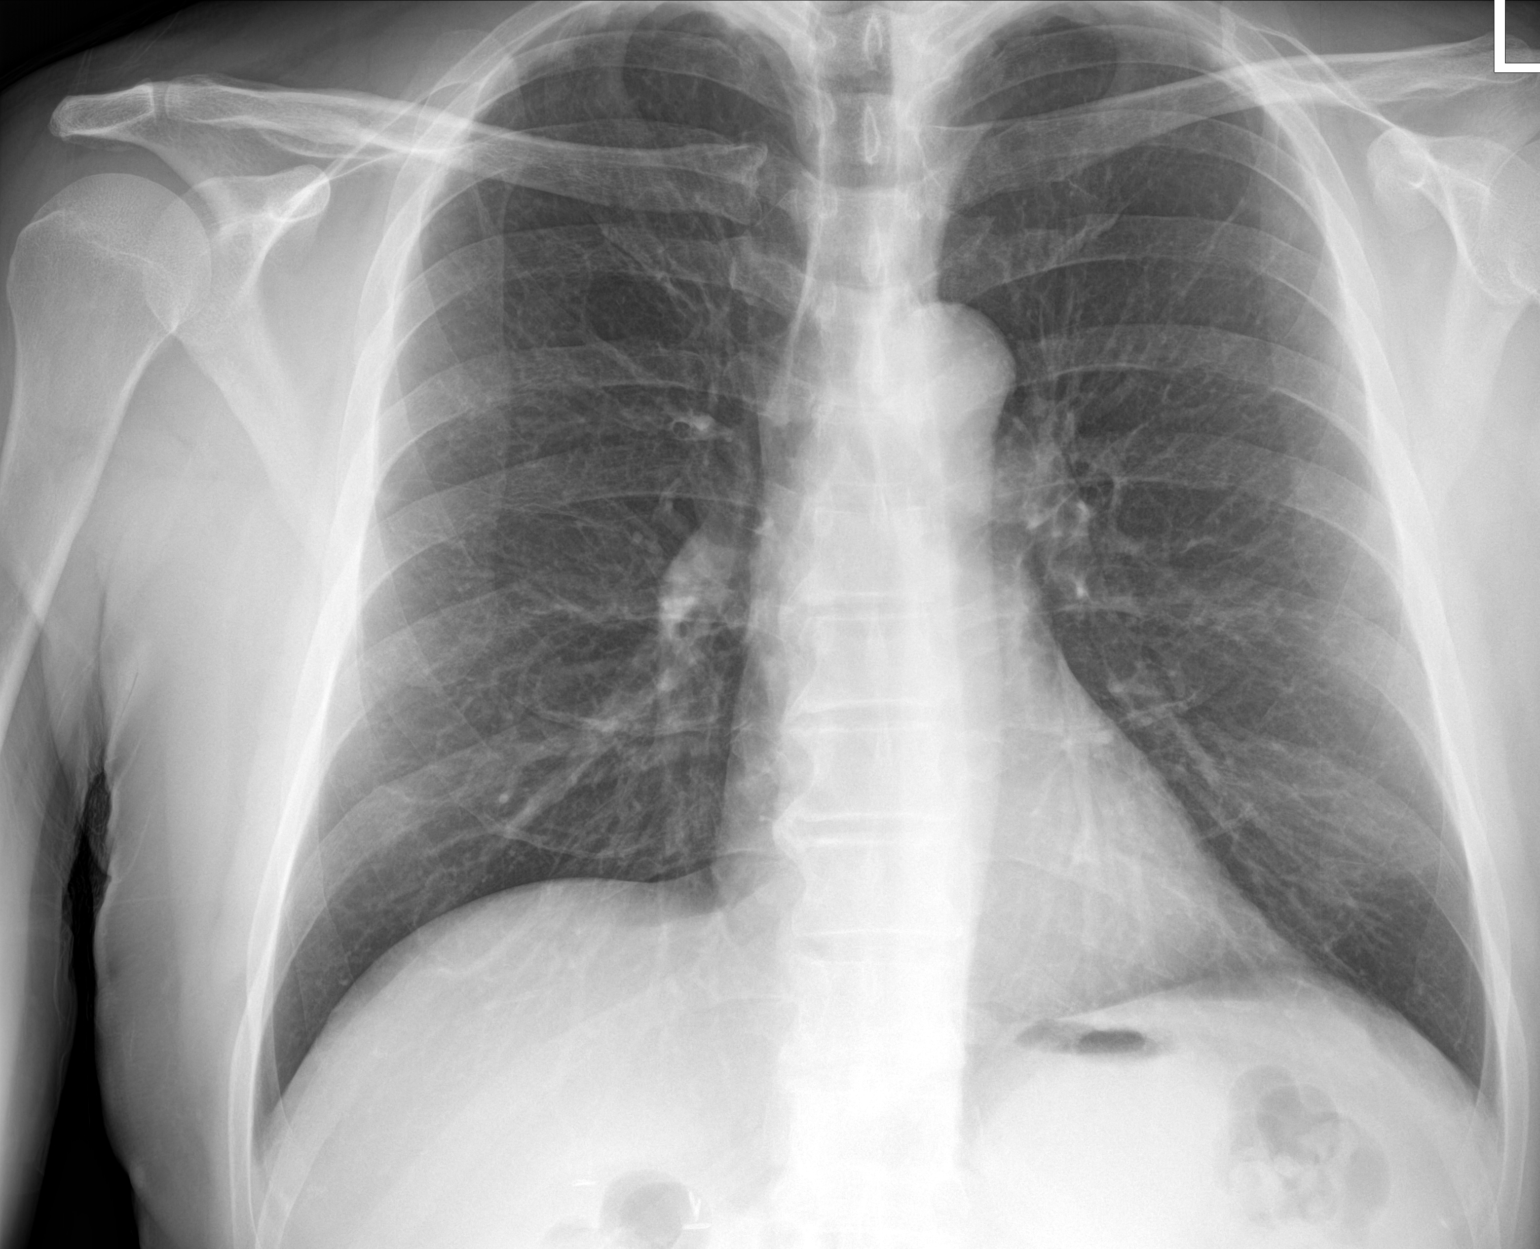

[2 of 2 positions shown; findings below may reference images not displayed]

FINDINGS: The heart size and mediastinal contours are within normal limits.
Both lungs are clear. The visualized skeletal structures are
unremarkable.
IMPRESSION: No active cardiopulmonary disease.

## 2023-03-01 ENCOUNTER — Telehealth: Payer: Self-pay

## 2023-03-01 NOTE — Telephone Encounter (Signed)
Transition Care Management Follow-up Telephone Call Date of discharge and from where: 02/27/2023 [name]  How have you been since you were released from the hospital? Patient states that he is doing okay needs a referral for general surgery - wants to go back to [place] they done his previous hernia surgery  Any questions or concerns? No  Items Reviewed: Did the pt receive and understand the discharge instructions provided? Yes  Medications obtained and verified? Yes  Any new allergies since your discharge? No  Dietary orders reviewed? Yes Do you have support at home? Yes   Home Care and Equipment/Supplies: Were home health services ordered? not applicable If so, what is the name of the agency? NA  Has the agency set up a time to come to the patient's home? not applicable Were any new equipment or medical supplies ordered?  No What is the name of the medical supply agency? NA Were you able to get the supplies/equipment? not applicable Do you have any questions related to the use of the equipment or supplies? No  Functional Questionnaire: (I = Independent and D = Dependent) ADLs: i  Bathing/Dressing- i  Meal Prep- i  Eating- i  Maintaining continence- i  Transferring/Ambulation- i  Managing Meds- i  Follow up appointments reviewed:  PCP Hospital f/u appt confirmed? None scheduled at this time - patient will make appointment as needed   Specialist Hospital f/u appt confirmed?  Patients needs referral for General Surgery  Are transportation arrangements needed? No  If their condition worsens, is the pt aware to call PCP or go to the Emergency Dept.? Yes Was the patient provided with contact information for the PCP's office or ED? Yes Was to pt encouraged to call back with questions or concerns? Yes

## 2023-03-10 ENCOUNTER — Other Ambulatory Visit: Payer: Self-pay | Admitting: Nurse Practitioner

## 2023-03-10 DIAGNOSIS — R0981 Nasal congestion: Secondary | ICD-10-CM

## 2023-06-05 ENCOUNTER — Other Ambulatory Visit: Payer: Self-pay | Admitting: Nurse Practitioner

## 2023-06-05 DIAGNOSIS — R0981 Nasal congestion: Secondary | ICD-10-CM

## 2023-06-07 ENCOUNTER — Encounter: Payer: Self-pay | Admitting: Nurse Practitioner

## 2023-06-07 NOTE — Telephone Encounter (Signed)
MMM NTBS Last OV 10/17/21 NO RF sent to pharmacy last OV greater than a year

## 2023-06-07 NOTE — Telephone Encounter (Signed)
NA. Letter mailed
